# Patient Record
Sex: Female | Born: 1980
Health system: Southern US, Community
[De-identification: ages and names within clinical notes are randomized; demographics above are authoritative.]

## PROBLEM LIST (undated history)

## (undated) DIAGNOSIS — O034 Incomplete spontaneous abortion without complication: Secondary | ICD-10-CM

## (undated) DIAGNOSIS — A6000 Herpesviral infection of urogenital system, unspecified: Secondary | ICD-10-CM

---
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
## 2002-09-05 ENCOUNTER — Other Ambulatory Visit: Admission: RE | Admit: 2002-09-05 | Discharge: 2002-09-05 | Payer: Self-pay | Admitting: *Deleted

## 2003-10-08 ENCOUNTER — Other Ambulatory Visit: Admission: RE | Admit: 2003-10-08 | Discharge: 2003-10-08 | Payer: Self-pay | Admitting: *Deleted

## 2004-10-16 ENCOUNTER — Other Ambulatory Visit: Admission: RE | Admit: 2004-10-16 | Discharge: 2004-10-16 | Payer: Self-pay | Admitting: Obstetrics and Gynecology

## 2005-08-17 ENCOUNTER — Other Ambulatory Visit: Admission: RE | Admit: 2005-08-17 | Discharge: 2005-08-17 | Payer: Self-pay | Admitting: Obstetrics and Gynecology

## 2014-10-12 HISTORY — PX: LEEP: SHX91

## 2016-07-20 DIAGNOSIS — N6320 Unspecified lump in the left breast, unspecified quadrant: Secondary | ICD-10-CM | POA: Diagnosis not present

## 2016-08-04 DIAGNOSIS — N6002 Solitary cyst of left breast: Secondary | ICD-10-CM | POA: Diagnosis not present

## 2016-08-04 DIAGNOSIS — N6320 Unspecified lump in the left breast, unspecified quadrant: Secondary | ICD-10-CM | POA: Diagnosis not present

## 2016-09-07 DIAGNOSIS — H43813 Vitreous degeneration, bilateral: Secondary | ICD-10-CM | POA: Diagnosis not present

## 2016-09-15 DIAGNOSIS — Z23 Encounter for immunization: Secondary | ICD-10-CM | POA: Diagnosis not present

## 2016-09-23 DIAGNOSIS — F411 Generalized anxiety disorder: Secondary | ICD-10-CM | POA: Diagnosis not present

## 2016-10-06 DIAGNOSIS — F411 Generalized anxiety disorder: Secondary | ICD-10-CM | POA: Diagnosis not present

## 2016-10-13 DIAGNOSIS — F411 Generalized anxiety disorder: Secondary | ICD-10-CM | POA: Diagnosis not present

## 2016-10-20 DIAGNOSIS — F411 Generalized anxiety disorder: Secondary | ICD-10-CM | POA: Diagnosis not present

## 2016-11-03 DIAGNOSIS — F411 Generalized anxiety disorder: Secondary | ICD-10-CM | POA: Diagnosis not present

## 2016-11-10 DIAGNOSIS — F411 Generalized anxiety disorder: Secondary | ICD-10-CM | POA: Diagnosis not present

## 2016-11-12 DIAGNOSIS — F411 Generalized anxiety disorder: Secondary | ICD-10-CM | POA: Diagnosis not present

## 2016-11-17 DIAGNOSIS — F411 Generalized anxiety disorder: Secondary | ICD-10-CM | POA: Diagnosis not present

## 2016-12-15 DIAGNOSIS — F411 Generalized anxiety disorder: Secondary | ICD-10-CM | POA: Diagnosis not present

## 2016-12-22 DIAGNOSIS — F411 Generalized anxiety disorder: Secondary | ICD-10-CM | POA: Diagnosis not present

## 2017-01-05 DIAGNOSIS — F411 Generalized anxiety disorder: Secondary | ICD-10-CM | POA: Diagnosis not present

## 2017-01-26 DIAGNOSIS — F329 Major depressive disorder, single episode, unspecified: Secondary | ICD-10-CM | POA: Diagnosis not present

## 2017-01-26 DIAGNOSIS — F429 Obsessive-compulsive disorder, unspecified: Secondary | ICD-10-CM | POA: Diagnosis not present

## 2017-01-26 DIAGNOSIS — F410 Panic disorder [episodic paroxysmal anxiety] without agoraphobia: Secondary | ICD-10-CM | POA: Diagnosis not present

## 2017-02-09 DIAGNOSIS — F411 Generalized anxiety disorder: Secondary | ICD-10-CM | POA: Diagnosis not present

## 2017-02-14 DIAGNOSIS — N390 Urinary tract infection, site not specified: Secondary | ICD-10-CM | POA: Diagnosis not present

## 2017-02-14 DIAGNOSIS — N7689 Other specified inflammation of vagina and vulva: Secondary | ICD-10-CM | POA: Diagnosis not present

## 2017-02-23 DIAGNOSIS — F411 Generalized anxiety disorder: Secondary | ICD-10-CM | POA: Diagnosis not present

## 2017-02-23 DIAGNOSIS — F408 Other phobic anxiety disorders: Secondary | ICD-10-CM | POA: Diagnosis not present

## 2017-02-23 DIAGNOSIS — F329 Major depressive disorder, single episode, unspecified: Secondary | ICD-10-CM | POA: Diagnosis not present

## 2017-03-02 DIAGNOSIS — F422 Mixed obsessional thoughts and acts: Secondary | ICD-10-CM | POA: Diagnosis not present

## 2017-03-16 DIAGNOSIS — F422 Mixed obsessional thoughts and acts: Secondary | ICD-10-CM | POA: Diagnosis not present

## 2017-03-31 DIAGNOSIS — Z118 Encounter for screening for other infectious and parasitic diseases: Secondary | ICD-10-CM | POA: Diagnosis not present

## 2017-03-31 DIAGNOSIS — R300 Dysuria: Secondary | ICD-10-CM | POA: Diagnosis not present

## 2017-03-31 DIAGNOSIS — Z114 Encounter for screening for human immunodeficiency virus [HIV]: Secondary | ICD-10-CM | POA: Diagnosis not present

## 2017-03-31 DIAGNOSIS — R309 Painful micturition, unspecified: Secondary | ICD-10-CM | POA: Diagnosis not present

## 2017-03-31 DIAGNOSIS — Z1159 Encounter for screening for other viral diseases: Secondary | ICD-10-CM | POA: Diagnosis not present

## 2017-03-31 DIAGNOSIS — Z113 Encounter for screening for infections with a predominantly sexual mode of transmission: Secondary | ICD-10-CM | POA: Diagnosis not present

## 2017-03-31 DIAGNOSIS — N9089 Other specified noninflammatory disorders of vulva and perineum: Secondary | ICD-10-CM | POA: Diagnosis not present

## 2017-04-06 DIAGNOSIS — N9089 Other specified noninflammatory disorders of vulva and perineum: Secondary | ICD-10-CM | POA: Diagnosis not present

## 2017-04-06 DIAGNOSIS — F422 Mixed obsessional thoughts and acts: Secondary | ICD-10-CM | POA: Diagnosis not present

## 2017-04-20 DIAGNOSIS — F422 Mixed obsessional thoughts and acts: Secondary | ICD-10-CM | POA: Diagnosis not present

## 2017-04-20 DIAGNOSIS — Z1151 Encounter for screening for human papillomavirus (HPV): Secondary | ICD-10-CM | POA: Diagnosis not present

## 2017-04-20 DIAGNOSIS — Z6822 Body mass index (BMI) 22.0-22.9, adult: Secondary | ICD-10-CM | POA: Diagnosis not present

## 2017-04-20 DIAGNOSIS — R39198 Other difficulties with micturition: Secondary | ICD-10-CM | POA: Diagnosis not present

## 2017-04-20 DIAGNOSIS — Z01419 Encounter for gynecological examination (general) (routine) without abnormal findings: Secondary | ICD-10-CM | POA: Diagnosis not present

## 2017-07-31 DIAGNOSIS — Z23 Encounter for immunization: Secondary | ICD-10-CM | POA: Diagnosis not present

## 2018-04-13 DIAGNOSIS — S76311A Strain of muscle, fascia and tendon of the posterior muscle group at thigh level, right thigh, initial encounter: Secondary | ICD-10-CM | POA: Diagnosis not present

## 2018-04-13 DIAGNOSIS — M545 Low back pain: Secondary | ICD-10-CM | POA: Diagnosis not present

## 2018-04-18 DIAGNOSIS — M545 Low back pain: Secondary | ICD-10-CM | POA: Diagnosis not present

## 2018-04-18 DIAGNOSIS — S76311A Strain of muscle, fascia and tendon of the posterior muscle group at thigh level, right thigh, initial encounter: Secondary | ICD-10-CM | POA: Diagnosis not present

## 2018-04-21 DIAGNOSIS — Z1151 Encounter for screening for human papillomavirus (HPV): Secondary | ICD-10-CM | POA: Diagnosis not present

## 2018-04-21 DIAGNOSIS — N760 Acute vaginitis: Secondary | ICD-10-CM | POA: Diagnosis not present

## 2018-04-21 DIAGNOSIS — Z6820 Body mass index (BMI) 20.0-20.9, adult: Secondary | ICD-10-CM | POA: Diagnosis not present

## 2018-04-21 DIAGNOSIS — Z01419 Encounter for gynecological examination (general) (routine) without abnormal findings: Secondary | ICD-10-CM | POA: Diagnosis not present

## 2018-04-25 DIAGNOSIS — S76311A Strain of muscle, fascia and tendon of the posterior muscle group at thigh level, right thigh, initial encounter: Secondary | ICD-10-CM | POA: Diagnosis not present

## 2018-04-25 DIAGNOSIS — M545 Low back pain: Secondary | ICD-10-CM | POA: Diagnosis not present

## 2018-04-26 DIAGNOSIS — S76311A Strain of muscle, fascia and tendon of the posterior muscle group at thigh level, right thigh, initial encounter: Secondary | ICD-10-CM | POA: Diagnosis not present

## 2018-04-26 DIAGNOSIS — M545 Low back pain: Secondary | ICD-10-CM | POA: Diagnosis not present

## 2018-05-02 DIAGNOSIS — S76311A Strain of muscle, fascia and tendon of the posterior muscle group at thigh level, right thigh, initial encounter: Secondary | ICD-10-CM | POA: Diagnosis not present

## 2018-05-02 DIAGNOSIS — M545 Low back pain: Secondary | ICD-10-CM | POA: Diagnosis not present

## 2018-05-06 DIAGNOSIS — Z130 Encounter for screening for diseases of the blood and blood-forming organs and certain disorders involving the immune mechanism: Secondary | ICD-10-CM | POA: Diagnosis not present

## 2018-05-06 DIAGNOSIS — Z1329 Encounter for screening for other suspected endocrine disorder: Secondary | ICD-10-CM | POA: Diagnosis not present

## 2018-05-06 DIAGNOSIS — Z13220 Encounter for screening for lipoid disorders: Secondary | ICD-10-CM | POA: Diagnosis not present

## 2018-05-06 DIAGNOSIS — Z131 Encounter for screening for diabetes mellitus: Secondary | ICD-10-CM | POA: Diagnosis not present

## 2018-05-06 DIAGNOSIS — Z0000 Encounter for general adult medical examination without abnormal findings: Secondary | ICD-10-CM | POA: Diagnosis not present

## 2018-08-10 DIAGNOSIS — F4320 Adjustment disorder, unspecified: Secondary | ICD-10-CM | POA: Diagnosis not present

## 2019-05-05 DIAGNOSIS — M545 Low back pain: Secondary | ICD-10-CM | POA: Diagnosis not present

## 2019-05-05 DIAGNOSIS — S76311A Strain of muscle, fascia and tendon of the posterior muscle group at thigh level, right thigh, initial encounter: Secondary | ICD-10-CM | POA: Diagnosis not present

## 2019-05-19 DIAGNOSIS — Z20828 Contact with and (suspected) exposure to other viral communicable diseases: Secondary | ICD-10-CM | POA: Diagnosis not present

## 2019-05-19 DIAGNOSIS — Z6820 Body mass index (BMI) 20.0-20.9, adult: Secondary | ICD-10-CM | POA: Diagnosis not present

## 2019-06-25 DIAGNOSIS — Z1159 Encounter for screening for other viral diseases: Secondary | ICD-10-CM | POA: Diagnosis not present

## 2019-07-07 DIAGNOSIS — Z6820 Body mass index (BMI) 20.0-20.9, adult: Secondary | ICD-10-CM | POA: Diagnosis not present

## 2019-07-07 DIAGNOSIS — Z01419 Encounter for gynecological examination (general) (routine) without abnormal findings: Secondary | ICD-10-CM | POA: Diagnosis not present

## 2019-08-06 DIAGNOSIS — Z23 Encounter for immunization: Secondary | ICD-10-CM | POA: Diagnosis not present

## 2019-08-17 ENCOUNTER — Other Ambulatory Visit: Payer: Self-pay

## 2019-08-17 DIAGNOSIS — Z20822 Contact with and (suspected) exposure to covid-19: Secondary | ICD-10-CM

## 2019-08-19 LAB — NOVEL CORONAVIRUS, NAA: SARS-CoV-2, NAA: NOT DETECTED

## 2019-10-19 DIAGNOSIS — H6123 Impacted cerumen, bilateral: Secondary | ICD-10-CM | POA: Diagnosis not present

## 2019-10-20 ENCOUNTER — Ambulatory Visit: Payer: Self-pay | Attending: Internal Medicine

## 2019-10-20 DIAGNOSIS — Z20822 Contact with and (suspected) exposure to covid-19: Secondary | ICD-10-CM | POA: Diagnosis not present

## 2019-10-20 DIAGNOSIS — R519 Headache, unspecified: Secondary | ICD-10-CM | POA: Diagnosis not present

## 2019-10-22 LAB — NOVEL CORONAVIRUS, NAA: SARS-CoV-2, NAA: NOT DETECTED

## 2019-12-03 DIAGNOSIS — J069 Acute upper respiratory infection, unspecified: Secondary | ICD-10-CM | POA: Diagnosis not present

## 2019-12-03 DIAGNOSIS — Z20822 Contact with and (suspected) exposure to covid-19: Secondary | ICD-10-CM | POA: Diagnosis not present

## 2020-02-15 DIAGNOSIS — L700 Acne vulgaris: Secondary | ICD-10-CM | POA: Diagnosis not present

## 2020-07-28 DIAGNOSIS — Z23 Encounter for immunization: Secondary | ICD-10-CM | POA: Diagnosis not present

## 2020-08-06 DIAGNOSIS — Z6821 Body mass index (BMI) 21.0-21.9, adult: Secondary | ICD-10-CM | POA: Diagnosis not present

## 2020-08-06 DIAGNOSIS — Z01419 Encounter for gynecological examination (general) (routine) without abnormal findings: Secondary | ICD-10-CM | POA: Diagnosis not present

## 2020-08-06 DIAGNOSIS — Z30432 Encounter for removal of intrauterine contraceptive device: Secondary | ICD-10-CM | POA: Diagnosis not present

## 2020-08-06 DIAGNOSIS — N760 Acute vaginitis: Secondary | ICD-10-CM | POA: Diagnosis not present

## 2020-09-20 DIAGNOSIS — Z3689 Encounter for other specified antenatal screening: Secondary | ICD-10-CM | POA: Diagnosis not present

## 2020-09-20 DIAGNOSIS — Z3200 Encounter for pregnancy test, result unknown: Secondary | ICD-10-CM | POA: Diagnosis not present

## 2020-09-23 DIAGNOSIS — O209 Hemorrhage in early pregnancy, unspecified: Secondary | ICD-10-CM | POA: Diagnosis not present

## 2020-10-12 NOTE — L&D Delivery Note (Addendum)
   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 Hx LEEP, serial CL stable "Vanishing Twin" 1st trim  First Stage:  Induction of labor:membrane sweep x 2 Onset of labor: 0800 07/02/21 Augmentation: AROM ROM: 2237 07/02/21 Active labor onset: 1800 Analgesia /Anesthesia/Pain control intrapartum: hydrotherapy, IV fentanyl, Epidural   Second Stage:  Complete dilation at 07/02/2021  2240 Onset of pushing at 2240, could not tolerate perineal pressure and requested epidural. Rested x 1 hour then resumed pushing at 0040. FHR second stage category 1   Pushing initially in L and R lateral, H/K, then after epidural in semifowlers position with CNM and L&D staff support, Trey Paula and Shanda Bumps present for birth and supportive. Nuchal Cord: No  Delivery of a Live born female in cephalic presentation, position OA to ROT. Prolonged crowning with very firm perineal band, initially hymenal band sharply released, ineffective in allowing head delivery, then small R mediolateral episiotomy was able to effect delivery of head. Easy shoulders thereafter, Trey Paula assisted with body delivery and placing infant on mother's chest. Vigorous infant with strong cry.   Birth Weight:  3600 g Weight:, English: 7 lb 15 oz APGAR: 9, 9  Newborn Delivery   Birth date/time: 07/03/2021 01:13:00 Delivery type: Vaginal, Spontaneous    Cord double clamped after cessation of pulsation, cut by Trey Paula.  Collection of cord blood for typing completed. Cord blood donation-None  Arterial cord blood sample-No    Third Stage:  Placenta delivered-partial separation, sticky membranes necessitating manual removal from lower segment, cord with 3 vessels . Placenta with trailing calcified membranes, irregular contour, complete, eccentric cord insertion. Uterine tone firm, bleeding small Uterotonics: Pitocin bolus IV Placenta to L&D for  disposal.    laceration identified.  Episiotomy:Right Mediolateral  Local analgesia: epidural  Repair:3.0 vicryl in standard fashion Est. Blood Loss (mL):350.00   Complications: None   Mom to postpartum.  Baby Ellie to Couplet care / Skin to Skin.  Delivery Report:  Review the Delivery Report for details.     Signed: Neta Mends, CNM, MSN 07/03/2021, 1:49 AM

## 2020-10-25 DIAGNOSIS — Z8759 Personal history of other complications of pregnancy, childbirth and the puerperium: Secondary | ICD-10-CM | POA: Diagnosis not present

## 2020-10-29 DIAGNOSIS — Z3201 Encounter for pregnancy test, result positive: Secondary | ICD-10-CM | POA: Diagnosis not present

## 2020-11-14 DIAGNOSIS — Z3201 Encounter for pregnancy test, result positive: Secondary | ICD-10-CM | POA: Diagnosis not present

## 2020-11-29 DIAGNOSIS — Z3A09 9 weeks gestation of pregnancy: Secondary | ICD-10-CM | POA: Diagnosis not present

## 2020-11-29 DIAGNOSIS — O3110X2 Continuing pregnancy after spontaneous abortion of one fetus or more, unspecified trimester, fetus 2: Secondary | ICD-10-CM | POA: Diagnosis not present

## 2020-12-06 DIAGNOSIS — Z3A10 10 weeks gestation of pregnancy: Secondary | ICD-10-CM | POA: Diagnosis not present

## 2020-12-06 DIAGNOSIS — O09529 Supervision of elderly multigravida, unspecified trimester: Secondary | ICD-10-CM | POA: Diagnosis not present

## 2020-12-06 DIAGNOSIS — Z3689 Encounter for other specified antenatal screening: Secondary | ICD-10-CM | POA: Diagnosis not present

## 2020-12-26 NOTE — Progress Notes (Signed)
Date:  12/27/2020   ID:  Lynn [name], DOB Dec 01, 1980, MRN [id number]  PCP:  Shon Hale, MD  Cardiologist:  Tessa Lerner, DO, [facility] (established care 12/26/2020)  REASON FOR CONSULT: Palpitations   REQUESTING PHYSICIAN:  Vick Frees, MD [address] [place],  [place] [address]  Chief Complaint  Patient presents with  . Palpitations  . 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  She is referred to the office at the request of [name], [name], MD for evaluation of palpitations.  Patient states that she is currently 14 weeks into her second pregnancy and is progressing well.  Patient stated that at her 6-week mark she started noticing palpitations with her morning sickness.  However, the palpitations would last throughout the day, intermittent, for 5 seconds in duration and self-limited.  Due to her symptoms she is referred to cardiology for further evaluation and management.  Patient states that since her morning sickness has now improved significantly still has her palpitations.  She continues to have palpitations but not as often.  She probably has 1 or 2 episodes at max per day.  No new medications, herbal supplements, energy drinks, stimulants, and no more than 1 cup of coffee per day.  Her first pregnancy was overall unremarkable.  No prior history of gestational hypertension or diabetes.  No family history of premature coronary artery disease or sudden cardiac death.  Mom does have atrial fibrillation diagnosed in her 46s.  ALLERGIES: No Known Allergies  MEDICATION LIST PRIOR TO VISIT: No outpatient medications have been marked as taking for the 12/27/20 encounter (Office Visit) with Odis Hollingshead, Shuaib Corsino, DO.     PAST MEDICAL HISTORY: History reviewed. No pertinent past medical  history.  PAST SURGICAL HISTORY: History reviewed. No pertinent surgical history.  FAMILY HISTORY: The patient family history includes Atrial fibrillation in her mother.  SOCIAL HISTORY:  The patient  reports that she has never smoked. She has never used smokeless tobacco. She reports that she does not drink alcohol and does not use drugs.  REVIEW OF SYSTEMS: Review of Systems  Constitutional: Negative for chills and fever.  HENT: Negative for hoarse voice and nosebleeds.   Eyes: Negative for discharge, double vision and pain.  Cardiovascular: Negative for chest pain, claudication, dyspnea on exertion, leg swelling, near-syncope, orthopnea, palpitations, paroxysmal nocturnal dyspnea and syncope.  Respiratory: Negative for hemoptysis and shortness of breath.   Musculoskeletal: Negative for muscle cramps and myalgias.  Gastrointestinal: Negative for abdominal pain, constipation, diarrhea, hematemesis, hematochezia, melena, nausea and vomiting.  Neurological: Negative for dizziness and light-headedness.    PHYSICAL EXAM: Vitals with BMI 12/27/2020  Height 5\' 5"   Weight 134 lbs 6 oz  BMI 22.37  Systolic 112  Diastolic 82  Pulse 81   CONSTITUTIONAL: Well-developed and well-nourished. No acute distress.  SKIN: Skin is warm and dry. No rash noted. No cyanosis. No pallor. No jaundice HEAD: Normocephalic and atraumatic.  EYES: No scleral icterus MOUTH/THROAT: Moist oral membranes.  NECK: No JVD present. No thyromegaly noted. No carotid bruits  LYMPHATIC: No visible cervical adenopathy.  CHEST Normal respiratory effort. No intercostal retractions  LUNGS: Clear to auscultation bilaterally.  No stridor. No wheezes. No rales.  CARDIOVASCULAR: Regular rate and rhythm, positive S1-S2, no murmurs rubs or gallops appreciated. ABDOMINAL: No apparent ascites.  EXTREMITIES: No peripheral  edema.  HEMATOLOGIC: No significant bruising NEUROLOGIC: Oriented to person, place, and time. Nonfocal.  Normal muscle tone.  PSYCHIATRIC: Normal mood and affect. Normal behavior. Cooperative  CARDIAC DATABASE: EKG: 12/27/2020: Normal sinus rhythm, 78 bpm, normal axis, without underlying ischemia or injury pattern.    Echocardiogram: No results found for this or any previous visit from the past 1095 days.   Stress Testing: No results found for this or any previous visit from the past 1095 days.  Heart Catheterization: None  LABORATORY DATA: No flowsheet data found.  No flowsheet data found.  Lipid Panel  No results found for: CHOL, TRIG, HDL, CHOLHDL, VLDL, LDLCALC, LDLDIRECT, LABVLDL  No components found for: NTPROBNP No results for input(s): PROBNP in the last 8760 hours. No results for input(s): TSH in the last 8760 hours.  BMP No results for input(s): NA, K, CL, CO2, GLUCOSE, BUN, CREATININE, CALCIUM, GFRNONAA, GFRAA in the last 8760 hours.  HEMOGLOBIN A1C No results found for: HGBA1C, MPG  IMPRESSION:    ICD-10-CM   1. Palpitations  R00.2 EKG 12-Lead    TSH    Magnesium    Basic metabolic panel    PCV ECHOCARDIOGRAM COMPLETE    LONG TERM MONITOR (3-14 DAYS)  2. [redacted] weeks gestation of pregnancy  Z3A.14      RECOMMENDATIONS: [name] is a 40 y.o. 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    Currently the symptoms of palpitations have improved as her morning sickness has essentially resolved.  However they do occur intermittently.  EKG shows normal sinus rhythm without underlying ischemia or injury pattern.  No recent labs for me to review at today's encounter.  I will order a BMP, magnesium, and TSH.  Patient is asked to send [place] a copy if she had recent blood work done at a different institution.  Echocardiogram will be ordered to evaluate for structural heart disease and left ventricular systolic function.  Plan for 14-day extended Holter monitor to evaluate for  underlying dysrhythmias if her symptoms were to worsen.  I will see the patient back in 3 months for reevaluation or sooner if needed.  FINAL MEDICATION LIST END OF ENCOUNTER: No orders of the defined types were placed in this encounter.   There are no discontinued medications.  No current outpatient medications on file.  Orders Placed This Encounter  Procedures  . TSH  . Magnesium  . Basic metabolic panel  . LONG TERM MONITOR (3-14 DAYS)  . EKG 12-Lead  . PCV ECHOCARDIOGRAM COMPLETE    There are no Patient Instructions on file for this visit.   --Continue cardiac medications as reconciled in final medication list. --Return in about 3 months (around 03/29/2021) for Follow up, Palpitations, Review test results. Or sooner if needed. --Continue follow-up with your primary care physician regarding the management of your other chronic comorbid conditions.  Patient's questions and concerns were addressed to her satisfaction. She voices understanding of the instructions provided during this encounter.   This note was created using a voice recognition software as a result there may be grammatical errors inadvertently enclosed that do not reflect the nature of this encounter. Every attempt is made to correct such errors.  Tessa Lerner, [place], [facility]  Pager: [phone] Office: [phone]

## 2020-12-27 ENCOUNTER — Ambulatory Visit: Payer: BC Managed Care – PPO | Admitting: Cardiology

## 2020-12-27 ENCOUNTER — Other Ambulatory Visit: Payer: Self-pay

## 2020-12-27 ENCOUNTER — Encounter: Payer: Self-pay | Admitting: Cardiology

## 2020-12-27 VITALS — BP 112/82 | HR 81 | Temp 98.7°F | Resp 16 | Ht 65.0 in | Wt 134.4 lb

## 2020-12-27 DIAGNOSIS — Z3A14 14 weeks gestation of pregnancy: Secondary | ICD-10-CM | POA: Diagnosis not present

## 2020-12-27 DIAGNOSIS — R002 Palpitations: Secondary | ICD-10-CM | POA: Diagnosis not present

## 2021-01-10 DIAGNOSIS — L710 Perioral dermatitis: Secondary | ICD-10-CM | POA: Diagnosis not present

## 2021-01-10 DIAGNOSIS — Z87898 Personal history of other specified conditions: Secondary | ICD-10-CM

## 2021-01-10 HISTORY — DX: Personal history of other specified conditions: Z87.898

## 2021-01-16 DIAGNOSIS — O09522 Supervision of elderly multigravida, second trimester: Secondary | ICD-10-CM | POA: Diagnosis not present

## 2021-01-16 DIAGNOSIS — Z361 Encounter for antenatal screening for raised alphafetoprotein level: Secondary | ICD-10-CM | POA: Diagnosis not present

## 2021-01-16 DIAGNOSIS — Z3A16 16 weeks gestation of pregnancy: Secondary | ICD-10-CM | POA: Diagnosis not present

## 2021-01-17 ENCOUNTER — Other Ambulatory Visit: Payer: Self-pay

## 2021-01-17 ENCOUNTER — Ambulatory Visit: Payer: BC Managed Care – PPO

## 2021-01-17 DIAGNOSIS — Z0189 Encounter for other specified special examinations: Secondary | ICD-10-CM | POA: Diagnosis not present

## 2021-01-17 DIAGNOSIS — R002 Palpitations: Secondary | ICD-10-CM

## 2021-02-13 DIAGNOSIS — O09522 Supervision of elderly multigravida, second trimester: Secondary | ICD-10-CM | POA: Diagnosis not present

## 2021-02-13 DIAGNOSIS — Z3A20 20 weeks gestation of pregnancy: Secondary | ICD-10-CM | POA: Diagnosis not present

## 2021-02-24 DIAGNOSIS — Z362 Encounter for other antenatal screening follow-up: Secondary | ICD-10-CM | POA: Diagnosis not present

## 2021-03-28 ENCOUNTER — Ambulatory Visit: Payer: BC Managed Care – PPO | Admitting: Cardiology

## 2021-04-11 DIAGNOSIS — Z3689 Encounter for other specified antenatal screening: Secondary | ICD-10-CM | POA: Diagnosis not present

## 2021-04-11 DIAGNOSIS — O09522 Supervision of elderly multigravida, second trimester: Secondary | ICD-10-CM | POA: Diagnosis not present

## 2021-04-25 DIAGNOSIS — Z23 Encounter for immunization: Secondary | ICD-10-CM | POA: Diagnosis not present

## 2021-06-04 ENCOUNTER — Encounter (HOSPITAL_COMMUNITY): Payer: Self-pay | Admitting: Obstetrics

## 2021-06-04 ENCOUNTER — Inpatient Hospital Stay (HOSPITAL_BASED_OUTPATIENT_CLINIC_OR_DEPARTMENT_OTHER): Payer: BC Managed Care – PPO

## 2021-06-04 ENCOUNTER — Other Ambulatory Visit: Payer: Self-pay

## 2021-06-04 ENCOUNTER — Inpatient Hospital Stay (HOSPITAL_COMMUNITY)
Admission: AD | Admit: 2021-06-04 | Discharge: 2021-06-04 | Disposition: A | Discharge status: Home / Self Care | Payer: BC Managed Care – PPO | Attending: Obstetrics | Admitting: Obstetrics

## 2021-06-04 DIAGNOSIS — Y93K9 Activity, other involving animal care: Secondary | ICD-10-CM | POA: Diagnosis not present

## 2021-06-04 DIAGNOSIS — Z3A36 36 weeks gestation of pregnancy: Secondary | ICD-10-CM

## 2021-06-04 DIAGNOSIS — O9A213 Injury, poisoning and certain other consequences of external causes complicating pregnancy, third trimester: Secondary | ICD-10-CM

## 2021-06-04 DIAGNOSIS — S3991XA Unspecified injury of abdomen, initial encounter: Secondary | ICD-10-CM | POA: Diagnosis not present

## 2021-06-04 DIAGNOSIS — W541XXA Struck by dog, initial encounter: Secondary | ICD-10-CM

## 2021-06-04 DIAGNOSIS — Z3685 Encounter for antenatal screening for Streptococcus B: Secondary | ICD-10-CM | POA: Diagnosis not present

## 2021-06-04 DIAGNOSIS — Z3689 Encounter for other specified antenatal screening: Secondary | ICD-10-CM

## 2021-06-04 DIAGNOSIS — T1490XA Injury, unspecified, initial encounter: Secondary | ICD-10-CM

## 2021-06-04 NOTE — MAU Note (Signed)
Bailea Beed is a 40 y.o. at [redacted]w[redacted]d here in MAU reporting: today while at work she got a hit by a door and a dog in her abdomen. Was at the office and was having some uterine irritability and told to come here for further evaluation. No pain, bleeding, or LOF. +FM  Onset of complaint: today  Pain score: 0/10  Vitals:   06/04/21 1348  BP: 115/74  Pulse: 70  Resp: 16  Temp: 98.3 F (36.8 C)  SpO2: 98%     FHT:128  Lab orders placed from triage: none

## 2021-06-04 NOTE — MAU Provider Note (Signed)
History     CSN: [id number]  Arrival date and time: 06/04/21 1338   Event Date/Time   First Provider Initiated Contact with Patient 06/04/21 1421      Chief Complaint  Patient presents with   Fetal Monitoring   HPI  Ms.Shawni Hegg is a 40 y.o.female G2P1001 @ [redacted]w[redacted]d here in MAU for fetal monitoring. At 10:00 am this morning a large dog jump onto her chest/belly while she was standing.  She works at a [employer]. She never had pain or bleeding during or following the event.  She was seen today in the office for a regular scheduled OB appointment and was instructed to come here to have her baby monitored. She reports uterine irritability that is not new.   + fetal movement.   OB History     Gravida  2   Para  1   Term  1   Preterm      AB      Living  1      SAB      IAB      Ectopic      Multiple      Live Births  1           History reviewed. No pertinent past medical history.  History reviewed. No pertinent surgical history.  Family History  Problem Relation Age of Onset   Atrial fibrillation Mother     Social History   Tobacco Use   Smoking status: Never   Smokeless tobacco: Never  Vaping Use   Vaping Use: Never used  Substance Use Topics   Alcohol use: Never   Drug use: Never    Allergies: No Known Allergies  Medications Prior to Admission  Medication Sig Dispense Refill Last Dose   valACYclovir (VALTREX) 500 MG tablet Take 500 mg by mouth 2 (two) times daily.   06/04/2021   No results found for this or any previous visit (from the past 48 hour(s)).   Review of Systems  Constitutional:  Negative for fever.  Gastrointestinal:  Negative for abdominal pain.  Genitourinary:  Negative for vaginal bleeding and vaginal discharge.  Neurological:  Negative for headaches.  Physical Exam   Blood pressure 115/74, pulse 70, temperature 98.3 F (36.8 C), temperature source Oral, resp. rate 16, height 5\' 5"  (1.651 m), weight 70.4 kg, SpO2  98 %.  Physical Exam Constitutional:      General: She is not in acute distress.    Appearance: Normal appearance. She is normal weight. She is not ill-appearing, toxic-appearing or diaphoretic.  HENT:     Head: Normocephalic.  Abdominal:     Palpations: Abdomen is soft.     Tenderness: There is no abdominal tenderness.  Musculoskeletal:        General: Normal range of motion.  Skin:    General: Skin is warm.  Neurological:     Mental Status: She is alert and oriented to person, place, and time.  Psychiatric:        Behavior: Behavior normal.   Fetal Tracing: Baseline: 125 bpm Variability: Moderate  Accelerations: 15x15 Decelerations: None Toco:  UI  MAU Course  Procedures None  MDM  Reactive NST, without signs of abruption.  Reviewed patient and NST with Dr. [place], ok for DC home.   Assessment and Plan   A:  1. NST (non-stress test) reactive   2. Trauma   3. [redacted] weeks gestation of pregnancy      P:  Discharge  home in stable condition Return to MAU if symptoms worsen Warning signs Fetal kick counts  Lattie Riege, Harolyn Rutherford, NP 06/04/2021 5:14 PM

## 2021-06-17 DIAGNOSIS — R609 Edema, unspecified: Secondary | ICD-10-CM | POA: Diagnosis not present

## 2021-06-26 DIAGNOSIS — M5489 Other dorsalgia: Secondary | ICD-10-CM | POA: Diagnosis not present

## 2021-06-30 ENCOUNTER — Inpatient Hospital Stay (HOSPITAL_COMMUNITY): Admit: 2021-06-30 | Payer: Self-pay

## 2021-06-30 DIAGNOSIS — Z0374 Encounter for suspected problem with fetal growth ruled out: Secondary | ICD-10-CM | POA: Diagnosis not present

## 2021-07-02 ENCOUNTER — Other Ambulatory Visit: Payer: Self-pay

## 2021-07-02 ENCOUNTER — Inpatient Hospital Stay (HOSPITAL_COMMUNITY): Payer: BC Managed Care – PPO | Admitting: Anesthesiology

## 2021-07-02 ENCOUNTER — Inpatient Hospital Stay (HOSPITAL_COMMUNITY)
Admission: AD | Admit: 2021-07-02 | Discharge: 2021-07-04 | DRG: 807 | Disposition: A | Discharge status: Home / Self Care | Payer: BC Managed Care – PPO | Attending: Obstetrics & Gynecology | Admitting: Obstetrics & Gynecology

## 2021-07-02 ENCOUNTER — Encounter (HOSPITAL_COMMUNITY): Payer: Self-pay | Admitting: Obstetrics & Gynecology

## 2021-07-02 DIAGNOSIS — Z20822 Contact with and (suspected) exposure to covid-19: Secondary | ICD-10-CM | POA: Diagnosis present

## 2021-07-02 DIAGNOSIS — O9832 Other infections with a predominantly sexual mode of transmission complicating childbirth: Secondary | ICD-10-CM | POA: Diagnosis not present

## 2021-07-02 DIAGNOSIS — O43123 Velamentous insertion of umbilical cord, third trimester: Secondary | ICD-10-CM | POA: Diagnosis present

## 2021-07-02 DIAGNOSIS — A6000 Herpesviral infection of urogenital system, unspecified: Secondary | ICD-10-CM | POA: Diagnosis not present

## 2021-07-02 DIAGNOSIS — Z3A40 40 weeks gestation of pregnancy: Secondary | ICD-10-CM | POA: Diagnosis not present

## 2021-07-02 DIAGNOSIS — Z23 Encounter for immunization: Secondary | ICD-10-CM

## 2021-07-02 DIAGNOSIS — O900 Disruption of cesarean delivery wound: Secondary | ICD-10-CM | POA: Diagnosis not present

## 2021-07-02 DIAGNOSIS — O26893 Other specified pregnancy related conditions, third trimester: Secondary | ICD-10-CM | POA: Diagnosis not present

## 2021-07-02 HISTORY — DX: Herpesviral infection of urogenital system, unspecified: A60.00

## 2021-07-02 LAB — CBC
HCT: 40.4 % (ref 36.0–46.0)
Hemoglobin: 14.1 g/dL (ref 12.0–15.0)
MCH: 33.6 pg (ref 26.0–34.0)
MCHC: 34.9 g/dL (ref 30.0–36.0)
MCV: 96.2 fL (ref 80.0–100.0)
Platelets: 159 10*3/uL (ref 150–400)
RBC: 4.2 MIL/uL (ref 3.87–5.11)
RDW: 12.4 % (ref 11.5–15.5)
WBC: 12.1 10*3/uL — ABNORMAL HIGH (ref 4.0–10.5)
nRBC: 0 % (ref 0.0–0.2)

## 2021-07-02 LAB — TYPE AND SCREEN
ABO/RH(D): A POS
Antibody Screen: NEGATIVE

## 2021-07-02 LAB — RESP PANEL BY RT-PCR (FLU A&B, COVID) ARPGX2
Influenza A by PCR: NEGATIVE
Influenza B by PCR: NEGATIVE
SARS Coronavirus 2 by RT PCR: NEGATIVE

## 2021-07-02 MED ORDER — LACTATED RINGERS IV SOLN: 500.0000 mL | INTRAVENOUS | Status: DC | PRN

## 2021-07-02 MED ORDER — LIDOCAINE-EPINEPHRINE (PF) 1.5 %-1:200000 IJ SOLN
INTRAMUSCULAR | Status: DC | PRN
  Administered 2021-07-02: 5 mL via PERINEURAL

## 2021-07-02 MED ORDER — DIPHENHYDRAMINE HCL 50 MG/ML IJ SOLN: 12.5000 mg | INTRAMUSCULAR | Status: DC | PRN

## 2021-07-02 MED ORDER — OXYTOCIN 10 UNIT/ML IJ SOLN: 10.0000 [IU] | Freq: Once | INTRAMUSCULAR | Status: DC

## 2021-07-02 MED ORDER — OXYTOCIN BOLUS FROM INFUSION
333.0000 mL | Freq: Once | INTRAVENOUS | Status: AC
  Administered 2021-07-03: 333 mL via INTRAVENOUS

## 2021-07-02 MED ORDER — FENTANYL-BUPIVACAINE-NACL 0.5-0.125-0.9 MG/250ML-% EP SOLN
12.0000 mL/h | EPIDURAL | Status: DC | PRN
  Administered 2021-07-02: 12 mL/h via EPIDURAL

## 2021-07-02 MED ORDER — EPHEDRINE 5 MG/ML INJ: 10.0000 mg | INTRAVENOUS | Status: DC | PRN

## 2021-07-02 MED ORDER — ACETAMINOPHEN 325 MG PO TABS: 650.0000 mg | ORAL_TABLET | ORAL | Status: DC | PRN

## 2021-07-02 MED ORDER — LIDOCAINE HCL (PF) 1 % IJ SOLN
INTRAMUSCULAR | Status: DC | PRN
  Administered 2021-07-02: 3 mL via EPIDURAL

## 2021-07-02 MED ORDER — PHENYLEPHRINE 40 MCG/ML (10ML) SYRINGE FOR IV PUSH (FOR BLOOD PRESSURE SUPPORT): 80.0000 ug | PREFILLED_SYRINGE | INTRAVENOUS | Status: DC | PRN

## 2021-07-02 MED ORDER — SOD CITRATE-CITRIC ACID 500-334 MG/5ML PO SOLN: 30.0000 mL | ORAL | Status: DC | PRN

## 2021-07-02 MED ORDER — LACTATED RINGERS IV SOLN: INTRAVENOUS | Status: DC

## 2021-07-02 MED ORDER — FENTANYL CITRATE (PF) 100 MCG/2ML IJ SOLN
100.0000 ug | Freq: Once | INTRAMUSCULAR | Status: AC
  Administered 2021-07-02: 100 ug via INTRAVENOUS

## 2021-07-02 MED ORDER — LIDOCAINE HCL (PF) 1 % IJ SOLN: 30.0000 mL | INTRAMUSCULAR | Status: DC | PRN

## 2021-07-02 MED ORDER — FENTANYL-BUPIVACAINE-NACL 0.5-0.125-0.9 MG/250ML-% EP SOLN
EPIDURAL | Status: AC
  Filled 2021-07-02: qty 250

## 2021-07-02 MED ORDER — OXYTOCIN-SODIUM CHLORIDE 30-0.9 UT/500ML-% IV SOLN
2.5000 [IU]/h | INTRAVENOUS | Status: DC
  Filled 2021-07-02: qty 500

## 2021-07-02 MED ORDER — SODIUM CHLORIDE 0.9% FLUSH: 3.0000 mL | INTRAVENOUS | Status: DC | PRN

## 2021-07-02 MED ORDER — SODIUM CHLORIDE 0.9% FLUSH: 3.0000 mL | Freq: Two times a day (BID) | INTRAVENOUS | Status: DC

## 2021-07-02 MED ORDER — FENTANYL CITRATE (PF) 100 MCG/2ML IJ SOLN
INTRAMUSCULAR | Status: AC
  Filled 2021-07-02: qty 2

## 2021-07-02 MED ORDER — SODIUM CHLORIDE 0.9 % IV SOLN: 250.0000 mL | INTRAVENOUS | Status: DC | PRN

## 2021-07-02 MED ORDER — ONDANSETRON HCL 4 MG/2ML IJ SOLN
4.0000 mg | Freq: Four times a day (QID) | INTRAMUSCULAR | Status: DC | PRN
  Administered 2021-07-02: 4 mg via INTRAVENOUS
  Filled 2021-07-02: qty 2

## 2021-07-02 MED ORDER — LACTATED RINGERS IV SOLN: 500.0000 mL | Freq: Once | INTRAVENOUS | Status: DC

## 2021-07-02 NOTE — Anesthesia Procedure Notes (Addendum)
Epidural Patient location during procedure: OB  Staffing Anesthesiologist: Lucretia Kern, MD Performed: anesthesiologist   Preanesthetic Checklist Completed: patient identified, IV checked, risks and benefits discussed, monitors and equipment checked, pre-op evaluation and timeout performed  Epidural Patient position: sitting Prep: DuraPrep Patient monitoring: heart rate, continuous pulse ox and blood pressure Approach: midline Location: L3-L4 Injection technique: LOR air  Needle:  Needle type: Tuohy  Needle gauge: 18 G Needle length: 9 cm Needle insertion depth: 3.5 cm Catheter type: closed end Catheter size: 20 Guage Catheter at skin depth: 9 cm Test dose: negative and 1.5% lidocaine with Epi 1:200 K  Assessment Events: blood not aspirated, injection not painful, no injection resistance, no paresthesia and negative IV test  Additional Notes Reason for block:procedure for pain

## 2021-07-02 NOTE — Anesthesia Preprocedure Evaluation (Signed)

## 2021-07-02 NOTE — Progress Notes (Addendum)
S: Moaning w/ ctx, used tub for 1 hour then out, ctx more intense but no urge to push. Requests exam.  O: Vitals:   07/02/21 1921  BP: 134/81  Pulse: 81  Resp: 18  Temp: 98.2 F (36.8 C)  Weight: 69.9 kg  Height: 5\' 5"  (1.651 m)     FHT:  120's Doppler, no audible decels UC:   regular, every 2-3 minutes SVE:   Dilation: 9 Effacement (%): 100 Station: -1 Exam by:: Aneshia Jacquet CNM BBOW  A / P: Spontaneous labor, progressing normally  Fetal Wellbeing:  Category I Pain Control:   suboptimal coping with hydrotherapy, offered IV Fentanyl and AROM and agrees  Anticipated MOD:  NSVD  [phone], CNM, MSN 07/02/2021, 10:27 PM

## 2021-07-02 NOTE — MAU Note (Signed)
Pt taken to rm by CNM

## 2021-07-02 NOTE — MAU Note (Addendum)
Pt brought back to MAU by Ivonne Andrew CNM. Taken to RM 130 with ctxs all day. Some bloody show. SVE by CNM and pt 7-8cm. Desires water birth. BS called by Ivonne Andrew CNM with report and awaiting call back from [facility] with room assignment.

## 2021-07-02 NOTE — H&P (Signed)
OB ADMISSION/ HISTORY & PHYSICAL:  Admission Date: 07/02/2021  7:12 PM  Admit Diagnosis: Normal labor [O80, Z37.9]    Lynn [name] is a 40 y.o. female presenting for labor check. Contractions started this morning with increasing frequency and strength for past 2 hours, denies LOF or VB. Notes some pelvic pressure, (+)FM.  S/P repeated membrane sweep x 2 this week, was 3/90/-1 yesterday.  Spouse Trey Paula and doula Shanda Bumps for labor support. Expecting a surprise baby.  Desires hydrotherapy/WB, class completed.   Prenatal History: G2P1001   EDC : 06/30/2021, by 7 wks sono Prenatal care at [facility] since 10 wks. 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 Hx LEEP, serial CL stable "Vanishing Twin" 1st trim  Prenatal Labs: ABO, Rh:   A pos Antibody:  neg Rubella:   imm RPR:   NR HBsAg:   neg HIV:   neg GBS:   neg 1 hr Glucola : 95 Genetic Screening: declined, AFP 1 normal Ultrasound: normal anatomy, posterior placenta, AGA  Vaccines: TDaP          UTD         Flu             planned at discharge                    COVID-19 UTD  Maternal Diabetes: No Genetic Screening: Declined Maternal Ultrasounds/Referrals: Normal Fetal Ultrasounds or other Referrals:  None Maternal Substance Abuse:  No Significant Maternal Medications:  Meds include: Other: Valtrex Significant Maternal Lab Results:  Group B Strep negative Other Comments:  None  Medical / Surgical History :  Past medical history:  Past Medical History:  Diagnosis Date   Herpes genitalia      Past surgical history: History reviewed. No pertinent surgical history.   Family History:  Family History  Problem Relation Age of Onset   Atrial fibrillation Mother      Social History:  reports that she has never smoked. She has never used smokeless tobacco. She reports that she does not drink alcohol and does not use  drugs.  Allergies: Patient has no known allergies.   Current Medications at time of admission:  Medications Prior to Admission  Medication Sig Dispense Refill Last Dose   Prenatal Vit-Fe Fumarate-FA (PRENATAL MULTIVITAMIN) TABS tablet Take 1 tablet by mouth daily at 12 noon.   07/02/2021   valACYclovir (VALTREX) 500 MG tablet Take 500 mg by mouth 2 (two) times daily.        Review of Systems: ROS  Physical Exam: Vital signs and nursing notes reviewed.  Patient Vitals for the past 24 hrs:  BP Temp Pulse Resp Height Weight  07/02/21 1921 134/81 98.2 F (36.8 C) 81 18 5\' 5"  (1.651 m) 68.9 kg     General: AAO x 3, NAD, coping very well Heart: RRR Lungs:CTAB Abdomen: Gravid, NT, Leopold's vertex, fetal spine to maternal L Extremities: no edema Genitalia / VE: Dilation: 7 Effacement (%): 100 Station: -1 Presentation: Vertex Exam by:: Zamyia Gowell CNM  BBOW, vertex  FHR: 120 BPM, mod variability, + accels, no decels TOCO: Ctx q 2-4 min, palp moderate  Labs:   Pending T&S, CBC, RPR  No results for input(s): WBC, HGB, HCT, PLT in the last 72 hours.   Assessment/Plan:  40 y.o. G2P1001 at [redacted]w[redacted]d  Fetal wellbeing - FHT category 1 EFW 7 lbs, AGA  Labor: Active stage  Plan expectant management, water imersion for hydrotherapy when C19 test resulted, may use shower in interim  GBS neg Rubella immune Rh positive  Pain control: desires unmedicated/labor support, hydro Analgesia/anesthesia PRN  Anticipated MOD: NSVB  Plans to breastfeed, declines circumcision. POC discussed with patient and support team, all questions answered.  Dr Juliene Pina notified of admission / plan of care   Neta Mends CNM, MSN 07/02/2021, 7:39 PM

## 2021-07-03 ENCOUNTER — Encounter (HOSPITAL_COMMUNITY): Payer: Self-pay | Admitting: Obstetrics & Gynecology

## 2021-07-03 LAB — CBC
HCT: 36.6 % (ref 36.0–46.0)
Hemoglobin: 12.7 g/dL (ref 12.0–15.0)
MCH: 33.2 pg (ref 26.0–34.0)
MCHC: 34.7 g/dL (ref 30.0–36.0)
MCV: 95.8 fL (ref 80.0–100.0)
Platelets: 147 10*3/uL — ABNORMAL LOW (ref 150–400)
RBC: 3.82 MIL/uL — ABNORMAL LOW (ref 3.87–5.11)
RDW: 12.4 % (ref 11.5–15.5)
WBC: 16.9 10*3/uL — ABNORMAL HIGH (ref 4.0–10.5)
nRBC: 0 % (ref 0.0–0.2)

## 2021-07-03 LAB — RPR: RPR Ser Ql: NONREACTIVE

## 2021-07-03 MED ORDER — WITCH HAZEL-GLYCERIN EX PADS: 1.0000 "application " | MEDICATED_PAD | CUTANEOUS | Status: DC | PRN

## 2021-07-03 MED ORDER — SIMETHICONE 80 MG PO CHEW: 80.0000 mg | CHEWABLE_TABLET | ORAL | Status: DC | PRN

## 2021-07-03 MED ORDER — TETANUS-DIPHTH-ACELL PERTUSSIS 5-2.5-18.5 LF-MCG/0.5 IM SUSY: 0.5000 mL | PREFILLED_SYRINGE | Freq: Once | INTRAMUSCULAR | Status: DC

## 2021-07-03 MED ORDER — ONDANSETRON HCL 4 MG/2ML IJ SOLN: 4.0000 mg | INTRAMUSCULAR | Status: DC | PRN

## 2021-07-03 MED ORDER — BENZOCAINE-MENTHOL 20-0.5 % EX AERO
1.0000 "application " | INHALATION_SPRAY | CUTANEOUS | Status: DC | PRN
  Administered 2021-07-04: 1 via TOPICAL
  Filled 2021-07-03: qty 56

## 2021-07-03 MED ORDER — ACETAMINOPHEN 500 MG PO TABS
1000.0000 mg | ORAL_TABLET | Freq: Four times a day (QID) | ORAL | Status: DC
  Administered 2021-07-03 – 2021-07-04 (×4): 1000 mg via ORAL
  Filled 2021-07-03 (×4): qty 2

## 2021-07-03 MED ORDER — ZOLPIDEM TARTRATE 5 MG PO TABS: 5.0000 mg | ORAL_TABLET | Freq: Every evening | ORAL | Status: DC | PRN

## 2021-07-03 MED ORDER — COCONUT OIL OIL
1.0000 "application " | TOPICAL_OIL | Status: DC | PRN
  Administered 2021-07-04: 1 via TOPICAL

## 2021-07-03 MED ORDER — BISACODYL 10 MG RE SUPP: 10.0000 mg | Freq: Every day | RECTAL | Status: DC | PRN

## 2021-07-03 MED ORDER — ONDANSETRON HCL 4 MG PO TABS: 4.0000 mg | ORAL_TABLET | ORAL | Status: DC | PRN

## 2021-07-03 MED ORDER — DIBUCAINE (PERIANAL) 1 % EX OINT: 1.0000 "application " | TOPICAL_OINTMENT | CUTANEOUS | Status: DC | PRN

## 2021-07-03 MED ORDER — FLEET ENEMA 7-19 GM/118ML RE ENEM: 1.0000 | ENEMA | Freq: Every day | RECTAL | Status: DC | PRN

## 2021-07-03 MED ORDER — DIPHENHYDRAMINE HCL 25 MG PO CAPS: 25.0000 mg | ORAL_CAPSULE | Freq: Four times a day (QID) | ORAL | Status: DC | PRN

## 2021-07-03 MED ORDER — PRENATAL MULTIVITAMIN CH
1.0000 | ORAL_TABLET | Freq: Every day | ORAL | Status: DC
  Administered 2021-07-03 – 2021-07-04 (×2): 1 via ORAL
  Filled 2021-07-03 (×2): qty 1

## 2021-07-03 MED ORDER — IBUPROFEN 600 MG PO TABS
600.0000 mg | ORAL_TABLET | Freq: Four times a day (QID) | ORAL | Status: DC
  Administered 2021-07-03 – 2021-07-04 (×4): 600 mg via ORAL
  Filled 2021-07-03 (×4): qty 1

## 2021-07-03 MED ORDER — SENNOSIDES-DOCUSATE SODIUM 8.6-50 MG PO TABS: 2.0000 | ORAL_TABLET | ORAL | Status: DC

## 2021-07-03 MED ORDER — INFLUENZA VAC SPLIT QUAD 0.5 ML IM SUSY
0.5000 mL | PREFILLED_SYRINGE | INTRAMUSCULAR | Status: AC
  Administered 2021-07-04: 0.5 mL via INTRAMUSCULAR
  Filled 2021-07-03: qty 0.5

## 2021-07-03 NOTE — Anesthesia Postprocedure Evaluation (Signed)
Anesthesia Post Note  Patient: Lynn [name]  Procedure(s) Performed: AN AD HOC LABOR EPIDURAL     Patient location during evaluation: Mother Baby Anesthesia Type: Epidural Level of consciousness: awake and alert and oriented Pain management: satisfactory to patient Vital Signs Assessment: post-procedure vital signs reviewed and stable Respiratory status: respiratory function stable Cardiovascular status: stable Postop Assessment: no headache, no backache, epidural receding, patient able to bend at knees, no signs of nausea or vomiting, adequate PO intake and able to ambulate Anesthetic complications: no   No notable events documented.  Last Vitals:  Vitals:   07/03/21 1324 07/03/21 1700  BP: 113/75 112/76  Pulse: (!) 59 (!) 59  Resp: 17 17  Temp: 36.7 C 36.8 C  SpO2:      Last Pain:  Vitals:   07/03/21 1749  TempSrc:   PainSc: 0-No pain   Pain Goal: Patients Stated Pain Goal: 0 (07/02/21 1929)                 Karleen Dolphin

## 2021-07-04 MED ORDER — IBUPROFEN 600 MG PO TABS: 600.0000 mg | ORAL_TABLET | Freq: Four times a day (QID) | ORAL | 0 refills | Status: DC

## 2021-07-04 MED ORDER — BENZOCAINE-MENTHOL 20-0.5 % EX AERO: 1.0000 "application " | INHALATION_SPRAY | CUTANEOUS | Status: DC | PRN

## 2021-07-04 MED ORDER — SENNOSIDES-DOCUSATE SODIUM 8.6-50 MG PO TABS: 2.0000 | ORAL_TABLET | ORAL | Status: DC

## 2021-07-04 MED ORDER — ACETAMINOPHEN 500 MG PO TABS: 1000.0000 mg | ORAL_TABLET | Freq: Four times a day (QID) | ORAL | 0 refills | Status: DC

## 2021-07-04 MED ORDER — COCONUT OIL OIL: 1.0000 "application " | TOPICAL_OIL | 0 refills | Status: DC | PRN

## 2021-07-04 NOTE — Discharge Summary (Signed)
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 yo [id number] 45w3das admitted in Active Labor on 07/02/2021. Patient had an uncomplicated labor course as follows:  Membrane Rupture Time/Date: 10:37 PM ,07/02/2021   Delivery Method:Vaginal, Spontaneous  Episiotomy: Right Mediolateral  Lacerations:    Patient had an uncomplicated postpartum course.  She is ambulating, tolerating a regular diet, passing flatus, and urinating well. Patient is discharged home in stable condition on 07/04/21.  Newborn Data: Birth date:07/03/2021  Birth time:1:13 AM  Gender:Female  Living status:Living  Apgars:9 ,9  Weight:3600 g  "Ellie"   Magnesium Sulfate received: No BMZ received: No Rhophylac:N/A MMR:N/A T-DaP:Given prenatally Flu: Yes Transfusion:No  Physical exam  Vitals:   07/03/21 1324 07/03/21 1700 07/03/21 2011 07/04/21 0608  BP: 113/75 112/76 121/76 109/78  Pulse: (!) 59 (!) 59 66 (!) 56  Resp: 17 17 16 15   Temp: 98.1 F (36.7 C) 98.2 F (36.8 C) 98.3 F (36.8 C) 97.7 F (36.5 C)  TempSrc: Oral Oral Oral   SpO2:   100% 99%  Weight:      Height:       General: alert, cooperative, and no  distress Heart: RRR Lochia: appropriate Uterine Fundus: firm Perineum: well approximated episiotomy, mild edema, no ecchymosis or evidence of hematoma DVT Evaluation: No evidence of DVT seen on physical exam. Negative Homan's sign. No cords or calf tenderness. Labs: Lab Results  Component Value Date   WBC 16.9 (H) 07/03/2021   HGB 12.7 07/03/2021   HCT 36.6 07/03/2021   MCV 95.8 07/03/2021   PLT 147 (L) 07/03/2021   No flowsheet data found. Edinburgh Score: Edinburgh Postnatal Depression Scale Screening Tool 07/03/2021  I have been able to laugh and see the funny side of things. 0  I have looked forward with enjoyment to things. 0  I have blamed myself unnecessarily when things went wrong. 1  I have been anxious or worried for no good reason. 2  I have felt scared or panicky for no good reason. 2  Things have been getting on top of me. 0  I have been so unhappy that I have had difficulty sleeping. 0  I have felt sad or miserable. 0  I have been so unhappy that I have been crying. 1  The thought of harming myself has occurred to me. 0  Edinburgh Postnatal Depression Scale Total 6      After visit meds:  Allergies as of 07/04/2021   No Known Allergies      Medication List  STOP taking these medications    valACYclovir 500 MG tablet Commonly known as: VALTREX       TAKE these medications    acetaminophen 500 MG tablet Commonly known as: TYLENOL Take 2 tablets (1,000 mg total) by mouth every 6 (six) hours.   benzocaine-Menthol 20-0.5 % Aero Commonly known as: DERMOPLAST Apply 1 application topically as needed for irritation (perineal discomfort).   coconut oil Oil Apply 1 application topically as needed.   ibuprofen 600 MG tablet Commonly known as: ADVIL Take 1 tablet (600 mg total) by mouth every 6 (six) hours.   prenatal multivitamin Tabs tablet Take 1 tablet by mouth daily at 12 noon.   senna-docusate 8.6-50 MG tablet Commonly known as:  Senokot-S Take 2 tablets by mouth daily. Start taking on: July 05, 2021               Discharge Care Instructions  (From admission, onward)           Start     Ordered   07/04/21 0000  Discharge wound care:       Comments: Warm water sitz baths as needed   07/04/21 1118             Discharge home in stable condition Infant Feeding: Breast Infant Disposition:home with mother Discharge instruction: per After Visit Summary and Postpartum booklet. Activity: Advance as tolerated. Pelvic rest for 6 weeks.  Diet: low salt diet Anticipated Birth Control: IUD - Paragard  Postpartum Appointment:6 weeks Additional Postpartum F/U: Postpartum Depression checkup Future Appointments:No future appointments. Follow up Visit:  Follow-up Information     Juliene Pina, CNM. Schedule an appointment as soon as possible for a visit in 6 week(s).   Specialty: Obstetrics and Gynecology Why: Postpartum visit Contact information: [place] [address] [phone]                     07/04/2021 Darliss Cheney, CNM

## 2021-07-08 DIAGNOSIS — O901 Disruption of perineal obstetric wound: Secondary | ICD-10-CM | POA: Diagnosis not present

## 2021-07-08 DIAGNOSIS — Z9889 Other specified postprocedural states: Secondary | ICD-10-CM | POA: Diagnosis not present

## 2021-07-10 DIAGNOSIS — N94819 Vulvodynia, unspecified: Secondary | ICD-10-CM | POA: Diagnosis not present

## 2021-07-15 ENCOUNTER — Telehealth (HOSPITAL_COMMUNITY): Payer: Self-pay | Admitting: *Deleted

## 2021-07-15 NOTE — Telephone Encounter (Signed)
Attempted hospital discharge follow-up call. No answer received. Deforest Hoyles, RN, 07/15/21, [phone].

## 2021-08-04 DIAGNOSIS — N819 Female genital prolapse, unspecified: Secondary | ICD-10-CM | POA: Diagnosis not present

## 2021-08-12 DIAGNOSIS — U071 COVID-19: Secondary | ICD-10-CM

## 2021-08-12 HISTORY — DX: COVID-19: U07.1

## 2021-08-24 DIAGNOSIS — J029 Acute pharyngitis, unspecified: Secondary | ICD-10-CM | POA: Diagnosis not present

## 2021-08-24 DIAGNOSIS — H9209 Otalgia, unspecified ear: Secondary | ICD-10-CM | POA: Diagnosis not present

## 2021-08-25 DIAGNOSIS — Z3202 Encounter for pregnancy test, result negative: Secondary | ICD-10-CM | POA: Diagnosis not present

## 2021-08-26 ENCOUNTER — Encounter (HOSPITAL_BASED_OUTPATIENT_CLINIC_OR_DEPARTMENT_OTHER): Payer: Self-pay | Admitting: Obstetrics and Gynecology

## 2021-08-26 ENCOUNTER — Other Ambulatory Visit: Payer: Self-pay

## 2021-08-26 ENCOUNTER — Other Ambulatory Visit (HOSPITAL_COMMUNITY): Payer: Self-pay | Admitting: Obstetrics and Gynecology

## 2021-08-26 ENCOUNTER — Other Ambulatory Visit: Payer: Self-pay | Admitting: Obstetrics and Gynecology

## 2021-08-26 NOTE — Progress Notes (Addendum)
Spoke w/ via phone for pre-op interview---pt Lab needs dos----   none  per anesthesia surgery orders pending        Lab results------echo 01-17-2021 epic COVID test -----patient states asymptomatic no test needed Arrive at -------900 am 08-27-2021 NPO after MN NO Solid Food.  Clear liquids from MN until---745 am Med rec completed Medications to take morning of surgery -----none Diabetic medication -----n/a Patient instructed no nail polish to be worn day of surgery Patient instructed to bring photo id and insurance card day of surgery Patient aware to have Driver (ride ) / caregiver    for 24 hours after surgery  [name] Patient Special Instructions -----none Pre-Op special Istructions -----none Patient verbalized understanding of instructions that were given at this phone interview. Patient denies shortness of breath, chest pain, fever, cough at this phone interview.

## 2021-08-26 NOTE — Anesthesia Preprocedure Evaluation (Addendum)
Anesthesia Evaluation  Patient identified by MRN, date of birth, ID band Patient awake    Reviewed: Allergy & Precautions, NPO status , Patient's Chart, lab work & pertinent test results  Airway Mallampati: I  TM Distance: >3 FB Neck ROM: Full    Dental no notable dental hx. (+) Teeth Intact, Dental Advisory Given   Pulmonary former smoker,    Pulmonary exam normal breath sounds clear to auscultation       Cardiovascular Exercise Tolerance: Good negative cardio ROS Normal cardiovascular exam Rhythm:Regular Rate:Normal     Neuro/Psych negative neurological ROS  negative psych ROS   GI/Hepatic negative GI ROS, Neg liver ROS,   Endo/Other  negative endocrine ROS  Renal/GU negative Renal ROS     Musculoskeletal   Abdominal   Peds  Hematology   Anesthesia Other Findings   Reproductive/Obstetrics                            Anesthesia Physical Anesthesia Plan  ASA: 1  Anesthesia Plan: General   Post-op Pain Management:    Induction: Intravenous  PONV Risk Score and Plan: 4 or greater and Treatment may vary due to age or medical condition, Ondansetron, Dexamethasone and TIVA  Airway Management Planned: LMA  Additional Equipment: None  Intra-op Plan:   Post-operative Plan:   Informed Consent: I have reviewed the patients History and Physical, chart, labs and discussed the procedure including the risks, benefits and alternatives for the proposed anesthesia with the patient or authorized representative who has indicated his/her understanding and acceptance.     Dental advisory given  Plan Discussed with: CRNA and Anesthesiologist  Anesthesia Plan Comments: (TIVA LMA)       Anesthesia Quick Evaluation

## 2021-08-27 ENCOUNTER — Ambulatory Visit (HOSPITAL_COMMUNITY)
Admission: RE | Admit: 2021-08-27 | Discharge: 2021-08-27 | Disposition: A | Discharge status: Home / Self Care | Payer: BC Managed Care – PPO | Source: Ambulatory Visit | Attending: Obstetrics and Gynecology | Admitting: Obstetrics and Gynecology

## 2021-08-27 ENCOUNTER — Ambulatory Visit (HOSPITAL_BASED_OUTPATIENT_CLINIC_OR_DEPARTMENT_OTHER): Payer: BC Managed Care – PPO | Admitting: Certified Registered Nurse Anesthetist

## 2021-08-27 ENCOUNTER — Encounter (HOSPITAL_BASED_OUTPATIENT_CLINIC_OR_DEPARTMENT_OTHER): Payer: Self-pay | Admitting: Obstetrics and Gynecology

## 2021-08-27 ENCOUNTER — Encounter (HOSPITAL_BASED_OUTPATIENT_CLINIC_OR_DEPARTMENT_OTHER)
Admission: RE | Disposition: A | Discharge status: Home / Self Care | Payer: Self-pay | Source: Ambulatory Visit | Attending: Obstetrics and Gynecology

## 2021-08-27 DIAGNOSIS — Z87891 Personal history of nicotine dependence: Secondary | ICD-10-CM | POA: Insufficient documentation

## 2021-08-27 DIAGNOSIS — O021 Missed abortion: Secondary | ICD-10-CM

## 2021-08-27 DIAGNOSIS — O0289 Other abnormal products of conception: Secondary | ICD-10-CM | POA: Diagnosis not present

## 2021-08-27 HISTORY — DX: Incomplete spontaneous abortion without complication: O03.4

## 2021-08-27 HISTORY — PX: OPERATIVE ULTRASOUND: SHX5996

## 2021-08-27 HISTORY — PX: DILATION AND EVACUATION: SHX1459

## 2021-08-27 LAB — CBC
HCT: 42.4 % (ref 36.0–46.0)
Hemoglobin: 14.8 g/dL (ref 12.0–15.0)
MCH: 33.3 pg (ref 26.0–34.0)
MCHC: 34.9 g/dL (ref 30.0–36.0)
MCV: 95.5 fL (ref 80.0–100.0)
Platelets: 181 10*3/uL (ref 150–400)
RBC: 4.44 MIL/uL (ref 3.87–5.11)
RDW: 11.5 % (ref 11.5–15.5)
WBC: 6.1 10*3/uL (ref 4.0–10.5)
nRBC: 0 % (ref 0.0–0.2)

## 2021-08-27 LAB — TYPE AND SCREEN
ABO/RH(D): A POS
Antibody Screen: NEGATIVE

## 2021-08-27 SURGERY — DILATION AND EVACUATION, UTERUS
Anesthesia: General | Site: Vagina

## 2021-08-27 MED ORDER — PROPOFOL 10 MG/ML IV BOLUS
INTRAVENOUS | Status: DC | PRN
  Administered 2021-08-27: 200 mg via INTRAVENOUS

## 2021-08-27 MED ORDER — DEXMEDETOMIDINE (PRECEDEX) IN NS 20 MCG/5ML (4 MCG/ML) IV SYRINGE
PREFILLED_SYRINGE | INTRAVENOUS | Status: DC | PRN
  Administered 2021-08-27: 8 ug via INTRAVENOUS
  Administered 2021-08-27: 4 ug via INTRAVENOUS

## 2021-08-27 MED ORDER — OXYCODONE HCL 5 MG/5ML PO SOLN: 5.0000 mg | Freq: Once | ORAL | Status: DC | PRN

## 2021-08-27 MED ORDER — LIDOCAINE 2% (20 MG/ML) 5 ML SYRINGE
INTRAMUSCULAR | Status: AC
  Filled 2021-08-27: qty 5

## 2021-08-27 MED ORDER — PROPOFOL 10 MG/ML IV BOLUS
INTRAVENOUS | Status: AC
  Filled 2021-08-27: qty 20

## 2021-08-27 MED ORDER — MIDAZOLAM HCL 5 MG/5ML IJ SOLN
INTRAMUSCULAR | Status: DC | PRN
  Administered 2021-08-27: 1 mg via INTRAVENOUS

## 2021-08-27 MED ORDER — FENTANYL CITRATE (PF) 100 MCG/2ML IJ SOLN
INTRAMUSCULAR | Status: AC
  Filled 2021-08-27: qty 2

## 2021-08-27 MED ORDER — AMISULPRIDE (ANTIEMETIC) 5 MG/2ML IV SOLN: 10.0000 mg | Freq: Once | INTRAVENOUS | Status: DC | PRN

## 2021-08-27 MED ORDER — MIDAZOLAM HCL 2 MG/2ML IJ SOLN
INTRAMUSCULAR | Status: AC
  Filled 2021-08-27: qty 2

## 2021-08-27 MED ORDER — POVIDONE-IODINE 10 % EX SWAB: 2.0000 "application " | Freq: Once | CUTANEOUS | Status: DC

## 2021-08-27 MED ORDER — ONDANSETRON HCL 4 MG/2ML IJ SOLN
INTRAMUSCULAR | Status: DC | PRN
  Administered 2021-08-27: 4 mg via INTRAVENOUS

## 2021-08-27 MED ORDER — HYDROMORPHONE HCL 1 MG/ML IJ SOLN
0.2500 mg | INTRAMUSCULAR | Status: DC | PRN
Start: 2021-08-27 — End: 2021-08-27

## 2021-08-27 MED ORDER — ONDANSETRON HCL 4 MG/2ML IJ SOLN
INTRAMUSCULAR | Status: AC
  Filled 2021-08-27: qty 2

## 2021-08-27 MED ORDER — CEFAZOLIN SODIUM-DEXTROSE 2-4 GM/100ML-% IV SOLN
INTRAVENOUS | Status: AC
  Filled 2021-08-27: qty 100

## 2021-08-27 MED ORDER — DEXAMETHASONE SODIUM PHOSPHATE 4 MG/ML IJ SOLN
INTRAMUSCULAR | Status: DC | PRN
  Administered 2021-08-27: 5 mg via INTRAVENOUS

## 2021-08-27 MED ORDER — PROPOFOL 500 MG/50ML IV EMUL
INTRAVENOUS | Status: AC
  Filled 2021-08-27: qty 50

## 2021-08-27 MED ORDER — KETOROLAC TROMETHAMINE 30 MG/ML IJ SOLN
INTRAMUSCULAR | Status: DC | PRN
  Administered 2021-08-27: 30 mg via INTRAVENOUS

## 2021-08-27 MED ORDER — DEXAMETHASONE SODIUM PHOSPHATE 10 MG/ML IJ SOLN
INTRAMUSCULAR | Status: AC
  Filled 2021-08-27: qty 1

## 2021-08-27 MED ORDER — LACTATED RINGERS IV SOLN: INTRAVENOUS | Status: DC

## 2021-08-27 MED ORDER — MIDAZOLAM HCL 5 MG/5ML IJ SOLN: INTRAMUSCULAR | Status: DC | PRN

## 2021-08-27 MED ORDER — OXYCODONE HCL 5 MG PO TABS: 5.0000 mg | ORAL_TABLET | Freq: Once | ORAL | Status: DC | PRN

## 2021-08-27 MED ORDER — PROPOFOL 500 MG/50ML IV EMUL
INTRAVENOUS | Status: DC | PRN
  Administered 2021-08-27: 125 ug/kg/min via INTRAVENOUS

## 2021-08-27 MED ORDER — ONDANSETRON HCL 4 MG/2ML IJ SOLN: 4.0000 mg | Freq: Once | INTRAMUSCULAR | Status: DC | PRN

## 2021-08-27 MED ORDER — CEFAZOLIN SODIUM-DEXTROSE 2-4 GM/100ML-% IV SOLN
2.0000 g | INTRAVENOUS | Status: AC
  Administered 2021-08-27: 2 g via INTRAVENOUS

## 2021-08-27 MED ORDER — FENTANYL CITRATE (PF) 100 MCG/2ML IJ SOLN
INTRAMUSCULAR | Status: DC | PRN
  Administered 2021-08-27: 50 ug via INTRAVENOUS

## 2021-08-27 MED ORDER — LIDOCAINE HCL (CARDIAC) PF 100 MG/5ML IV SOSY
PREFILLED_SYRINGE | INTRAVENOUS | Status: DC | PRN
  Administered 2021-08-27: 100 mg via INTRAVENOUS

## 2021-08-27 MED ORDER — KETOROLAC TROMETHAMINE 30 MG/ML IJ SOLN
INTRAMUSCULAR | Status: AC
  Filled 2021-08-27: qty 1

## 2021-08-27 MED ORDER — ACETAMINOPHEN 10 MG/ML IV SOLN: 1000.0000 mg | Freq: Once | INTRAVENOUS | Status: DC | PRN

## 2021-08-27 MED ORDER — BUPIVACAINE HCL (PF) 0.25 % IJ SOLN
INTRAMUSCULAR | Status: DC | PRN
  Administered 2021-08-27: 20 mL

## 2021-08-27 SURGICAL SUPPLY — 22 items
CATH ROBINSON RED A/P 16FR (CATHETERS) ×4 IMPLANT
DECANTER SPIKE VIAL GLASS SM (MISCELLANEOUS) ×4 IMPLANT
FILTER UTR ASPR ASSEMBLY (MISCELLANEOUS) ×4 IMPLANT
GLOVE SURG LTX SZ6.5 (GLOVE) ×4 IMPLANT
GLOVE SURG NEOPR MICRO LF SZ7 (GLOVE) ×8 IMPLANT
GLOVE SURG UNDER POLY LF SZ6.5 (GLOVE) ×4 IMPLANT
GLOVE SURG UNDER POLY LF SZ7 (GLOVE) ×4 IMPLANT
GOWN STRL REUS W/ TWL LRG LVL3 (GOWN DISPOSABLE) ×4 IMPLANT
GOWN STRL REUS W/TWL LRG LVL3 (GOWN DISPOSABLE) ×8
HOSE CONNECTING 18IN BERKELEY (TUBING) ×4 IMPLANT
KIT BERKELEY 1ST TRI 3/8 NO TR (MISCELLANEOUS) ×4 IMPLANT
KIT BERKELEY 1ST TRIMESTER 3/8 (MISCELLANEOUS) ×4 IMPLANT
NS IRRIG 1000ML POUR BTL (IV SOLUTION) ×4 IMPLANT
PACK VAGINAL MINOR WOMEN LF (CUSTOM PROCEDURE TRAY) ×4 IMPLANT
PAD OB MATERNITY 4.3X12.25 (PERSONAL CARE ITEMS) ×4 IMPLANT
SET BERKELEY SUCTION TUBING (SUCTIONS) ×4 IMPLANT
TOWEL GREEN STERILE FF (TOWEL DISPOSABLE) ×8 IMPLANT
UNDERPAD 30X36 HEAVY ABSORB (UNDERPADS AND DIAPERS) ×4 IMPLANT
VACURETTE 10 RIGID CVD (CANNULA) IMPLANT
VACURETTE 7MM CVD STRL WRAP (CANNULA) IMPLANT
VACURETTE 8 RIGID CVD (CANNULA) ×4 IMPLANT
VACURETTE 9 RIGID CVD (CANNULA) IMPLANT

## 2021-08-27 NOTE — Op Note (Signed)
Lynn [name], Lynn [name] MEDICAL RECORD NO: [id number] ACCOUNT NO: [id number] DATE OF BIRTH: 1981-10-05 FACILITY: [facility] LOCATION: [facility] PHYSICIAN: Lenoard Aden, MD  Operative Report   DATE OF PROCEDURE: 08/27/2021   PREOPERATIVE DIAGNOSIS:  Retained products of conception with abnormal postpartum bleeding 8 weeks postpartum.  POSTOPERATIVE DIAGNOSIS:  Retained products of conception with abnormal postpartum bleeding 8 weeks postpartum.  PROCEDURE:  Ultrasound guided suction D and E.  SURGEON:  Lenoard Aden, MD.  ASSISTANT:  None.  ANESTHESIA:  Local and general.  ESTIMATED BLOOD LOSS:  Less than 50 mL.  COMPLICATIONS:  None.  DRAINS:  None.  COUNTS:  Correct.  The patient to recovery in good condition.  SPECIMEN:  To pathology.  BRIEF DESCRIPTION OF PROCEDURE:  After being apprised of the risks of anesthesia, infection, bleeding, uterine perforation, possible injury to surrounding organs, need for repair.  The patient was taken to the operating room and administered a general  anesthetic without complications.  Prepped and draped in usual sterile fashion and catheterized for 300 mL.  Exam under anesthesia reveals a retroflexed bulky uterus and no adnexal masses.  Ultrasound was performed showing thickened endometrium with  vascular flow.  Paracervical block with 20cc dilute marcaine solution in standard fashion. At this time, under ultrasound guidance, cervix easily dilated up to a #21 Pratt dilator.  An 8 mm curved suction curette placed and aspiration of products of conception was done without difficulty.   Repeat ultrasound done transvaginally after blunt curettage in a 4 quadrant method revealed the cavity to be empty.   Minimal bleeding noted.  The patient tolerated the procedure well, was awakened and transferred to recovery in good condition.     [email or web address] D: 08/27/2021 11:29:54 am T: 08/27/2021 9:45:00 pm  JOB: [id number]/ [id number]

## 2021-08-27 NOTE — Anesthesia Postprocedure Evaluation (Signed)
Anesthesia Post Note  Patient: Lynn [name]  Procedure(s) Performed: DILATATION AND EVACUATION (Vagina ) OPERATIVE ULTRASOUND (Abdomen)     Patient location during evaluation: PACU Anesthesia Type: General Level of consciousness: awake and alert Pain management: pain level controlled Vital Signs Assessment: post-procedure vital signs reviewed and stable Respiratory status: spontaneous breathing, nonlabored ventilation, respiratory function stable and patient connected to nasal cannula oxygen Cardiovascular status: blood pressure returned to baseline and stable Postop Assessment: no apparent nausea or vomiting Anesthetic complications: no   No notable events documented.  Last Vitals:  Vitals:   08/27/21 1200 08/27/21 1255  BP: 100/70 106/80  Pulse: (!) 54 (!) 56  Resp: 12 15  Temp:  36.5 C  SpO2: (!) 89% 100%    Last Pain:  Vitals:   08/27/21 1255  TempSrc:   PainSc: 0-No pain                 Trevor Iha

## 2021-08-27 NOTE — Discharge Instructions (Addendum)
DO NOT TAKE MOTRIN/ADVIL/IBUPROFEN until after 5:30 today.    Post Anesthesia Home Care Instructions  Activity: Get plenty of rest for the remainder of the day. A responsible adult should stay with you for 24 hours following the procedure.  For the next 24 hours, DO NOT: -Drive a car -Advertising copywriter -Drink alcoholic beverages -Take any medication unless instructed by your physician -Make any legal decisions or sign important papers.  Meals: Start with liquid foods such as gelatin or soup. Progress to regular foods as tolerated. Avoid greasy, spicy, heavy foods. If nausea and/or vomiting occur, drink only clear liquids until the nausea and/or vomiting subsides. Call your physician if vomiting continues.  Special Instructions/Symptoms: Your throat may feel dry or sore from the anesthesia or the breathing tube placed in your throat during surgery. If this causes discomfort, gargle with warm salt water. The discomfort should disappear within 24 hours.  If you had a scopolamine patch placed behind your ear for the management of post- operative nausea and/or vomiting:  1. The medication in the patch is effective for 72 hours, after which it should be removed.  Wrap patch in a tissue and discard in the trash. Wash hands thoroughly with soap and water. 2. You may remove the patch earlier than 72 hours if you experience unpleasant side effects which may include dry mouth, dizziness or visual disturbances. 3. Avoid touching the patch. Wash your hands with soap and water after contact with the patch.   DISCHARGE INSTRUCTIONS: D&C / D&E The following instructions have been prepared to help you care for yourself upon your return home.   Personal hygiene:  Use sanitary pads for vaginal drainage, not tampons.  Shower the day after your procedure.  NO tub baths, pools or Jacuzzis for 2-3 weeks.  Wipe front to back after using the bathroom.  Activity and limitations:  Do NOT drive or operate any  equipment for 24 hours. The effects of anesthesia are still present and drowsiness may result.  Do NOT rest in bed all day.  Walking is encouraged.  Walk up and down stairs slowly.  You may resume your normal activity in one to two days or as indicated by your physician.  Sexual activity: NO intercourse for at least 2 weeks after the procedure, or as indicated by your physician.  Diet: Eat a light meal as desired this evening. You may resume your usual diet tomorrow.  Return to work: You may resume your work activities in one to two days or as indicated by your doctor.  What to expect after your surgery: Expect to have vaginal bleeding/discharge for 2-3 days and spotting for up to 10 days. It is not unusual to have soreness for up to 1-2 weeks. You may have a slight burning sensation when you urinate for the first day. Mild cramps may continue for a couple of days. You may have a regular period in 2-6 weeks.  Call your doctor for any of the following:  Excessive vaginal bleeding, saturating and changing one pad every hour.  Inability to urinate 6 hours after discharge from hospital.  Pain not relieved by pain medication.  Fever of 100.4 F or greater.  Unusual vaginal discharge or odor.   Call for an appointment:

## 2021-08-27 NOTE — Anesthesia Procedure Notes (Signed)
Procedure Name: LMA Insertion Date/Time: 08/27/2021 11:05 AM Performed by: Cleda Clarks, CRNA Pre-anesthesia Checklist: Patient identified, Emergency Drugs available, Suction available and Patient being monitored Patient Re-evaluated:Patient Re-evaluated prior to induction Oxygen Delivery Method: Circle system utilized Preoxygenation: Pre-oxygenation with 100% oxygen Induction Type: IV induction Ventilation: Mask ventilation without difficulty LMA: LMA inserted LMA Size: 4.0 Number of attempts: 1 Placement Confirmation: positive ETCO2 Tube secured with: Tape Dental Injury: Teeth and Oropharynx as per pre-operative assessment

## 2021-08-27 NOTE — H&P (Signed)
Lynn [name] is an 40 y.o. female. 8wk pp with AUB and retained tissue by sono. Quant 41, Nl cbc  Pertinent Gynecological History: Menses: flow is moderate Bleeding: dysfunctional uterine bleeding Contraception: none DES exposure: denies Blood transfusions: none Sexually transmitted diseases: no past history Previous GYN Procedures: DNC  Last mammogram:  na  Date: na Last pap: normal Date: 2022 OB History: G4, P2   Menstrual History: Menarche age: 42 Patient's last menstrual period was 10/15/2020 (approximate).    Past Medical History:  Diagnosis Date   COVID 08/12/2021   sore throat x 1 week all symptoms resolved   Herpes genitalia    History of palpitations 01/2021   with pregnancy resolved now per pt   Retained products of conception after miscarriage     Past Surgical History:  Procedure Laterality Date   LEEP  2016    Family History  Problem Relation Age of Onset   Atrial fibrillation Mother     Social History:  reports that she quit smoking about 8 years ago. Her smoking use included cigarettes. She has a 5.00 pack-year smoking history. She has never used smokeless tobacco. She reports that she does not drink alcohol and does not use drugs.  Allergies: No Known Allergies  Medications Prior to Admission  Medication Sig Dispense Refill Last Dose   senna-docusate (SENOKOT-S) 8.6-50 MG tablet Take 2 tablets by mouth daily.   08/26/2021 at 2200   valACYclovir (VALTREX) 500 MG tablet Take 500 mg by mouth at bedtime.   08/26/2021 at 2200   Prenatal Vit-Fe Fumarate-FA (PRENATAL MULTIVITAMIN) TABS tablet Take 1 tablet by mouth daily at 12 noon.   08/20/2021    Review of Systems  Constitutional: Negative.   All other systems reviewed and are negative.  Blood pressure 120/82, pulse 63, temperature 98.1 F (36.7 C), temperature source Oral, resp. rate 14, height 5' (1.524 m), weight 61 kg, last menstrual period 10/15/2020, SpO2 100 %, unknown if currently  breastfeeding. Physical Exam Vitals and nursing note reviewed.  Constitutional:      Appearance: Normal appearance. She is normal weight.  HENT:     Head: Normocephalic and atraumatic.  Cardiovascular:     Rate and Rhythm: Normal rate.  Pulmonary:     Effort: Pulmonary effort is normal.     Breath sounds: Normal breath sounds.  Abdominal:     General: Abdomen is flat.     Palpations: Abdomen is soft.  Genitourinary:    General: Normal vulva.  Musculoskeletal:        General: Normal range of motion.     Cervical back: Normal range of motion and neck supple.  Skin:    General: Skin is warm and dry.  Neurological:     General: No focal deficit present.     Mental Status: She is alert and oriented to person, place, and time.  Psychiatric:        Mood and Affect: Mood normal.        Behavior: Behavior normal.    Results for orders placed or performed during the hospital encounter of 08/27/21 (from the past 24 hour(s))  CBC     Status: None   Collection Time: 08/27/21  9:31 AM  Result Value Ref Range   WBC 6.1 4.0 - 10.5 K/uL   RBC 4.44 3.87 - 5.11 MIL/uL   Hemoglobin 14.8 12.0 - 15.0 g/dL   HCT 38.1 82.9 - 93.7 %   MCV 95.5 80.0 - 100.0 fL   MCH  33.3 26.0 - 34.0 pg   MCHC 34.9 30.0 - 36.0 g/dL   RDW 16.1 09.6 - 04.5 %   Platelets 181 150 - 400 K/uL   nRBC 0.0 0.0 - 0.2 %    No results found.  Assessment/Plan: PP bleeding , likey retained POC Sono guided D&E, consent done.  Agostino Gorin J 08/27/2021, 10:05 AM

## 2021-08-27 NOTE — Op Note (Signed)
08/27/2021  11:26 AM  PATIENT:  Lynn [name]  40 y.o. female  PRE-OPERATIVE DIAGNOSIS:  8 week postpartum retained products of conception  POST-OPERATIVE DIAGNOSIS:  8 week postpartum retained products of conception  PROCEDURE:  Procedure(s): DILATATION AND EVACUATION OPERATIVE ULTRASOUND  SURGEON:  Surgeon(s): Olivia Mackie, MD  ASSISTANTS: none   ANESTHESIA:   local and general  ESTIMATED BLOOD LOSS: 25 mL   DRAINS: none   LOCAL MEDICATIONS USED:  MARCAINE    and Amount: 20 ml  SPECIMEN:  Source of Specimen:  POC  DISPOSITION OF SPECIMEN:  PATHOLOGY  COUNTS:  YES  DICTATION #: [id number]  PLAN OF CARE: dc home  PATIENT DISPOSITION:  PACU - hemodynamically stable.

## 2021-08-27 NOTE — Transfer of Care (Signed)
Immediate Anesthesia Transfer of Care Note  Patient: Lynn [name]  Procedure(s) Performed: DILATATION AND EVACUATION (Vagina ) OPERATIVE ULTRASOUND (Abdomen)  Patient Location: PACU  Anesthesia Type:General  Level of Consciousness: awake, alert  and oriented  Airway & Oxygen Therapy: Patient Spontanous Breathing and Patient connected to nasal cannula oxygen  Post-op Assessment: Report given to RN and Post -op Vital signs reviewed and stable  Post vital signs: Reviewed and stable  Last Vitals:  Vitals Value Taken Time  BP 112/75 08/27/21 1132  Temp    Pulse 65 08/27/21 1137  Resp 14 08/27/21 1137  SpO2 100 % 08/27/21 1137  Vitals shown include unvalidated device data.  Last Pain:  Vitals:   08/27/21 0912  TempSrc: Oral  PainSc: 0-No pain      Patients Stated Pain Goal: 5 (08/27/21 0912)  Complications: No notable events documented.

## 2021-08-28 ENCOUNTER — Encounter (HOSPITAL_BASED_OUTPATIENT_CLINIC_OR_DEPARTMENT_OTHER): Payer: Self-pay | Admitting: Obstetrics and Gynecology

## 2021-08-28 LAB — SURGICAL PATHOLOGY

## 2021-09-23 IMAGING — US US MFM OB LIMITED
1 series · 15 of 21 positions shown · non-contrast
Comparison: none

[Series 1: us mfm ob limited · 15 of 21 slices shown]
[im 1/21]
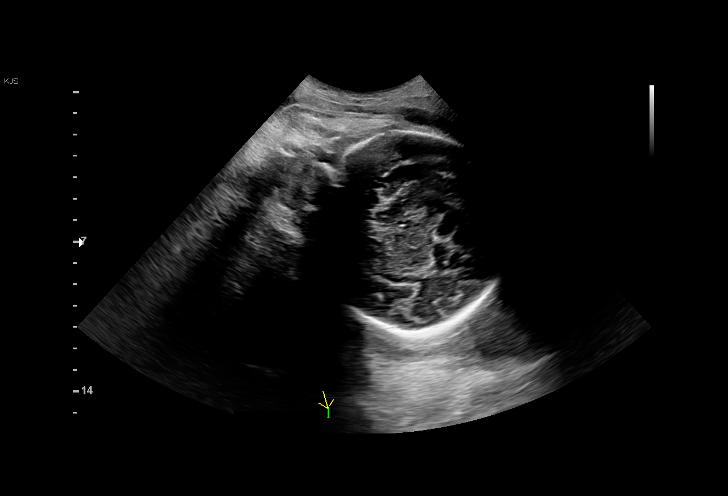
[im 3/21]
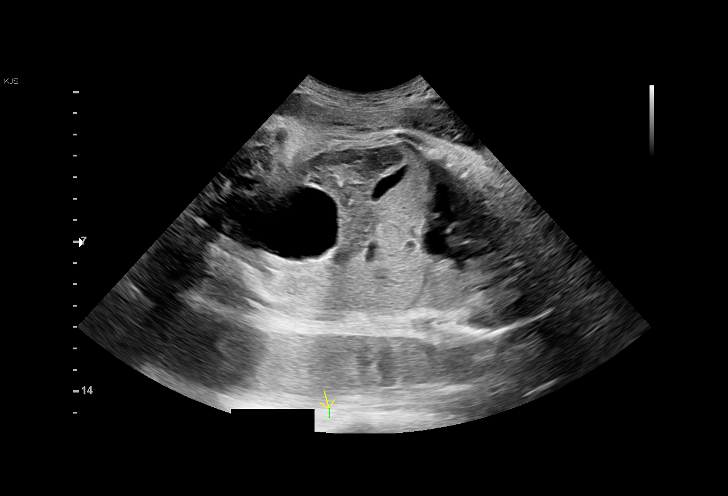
[im 4/21]
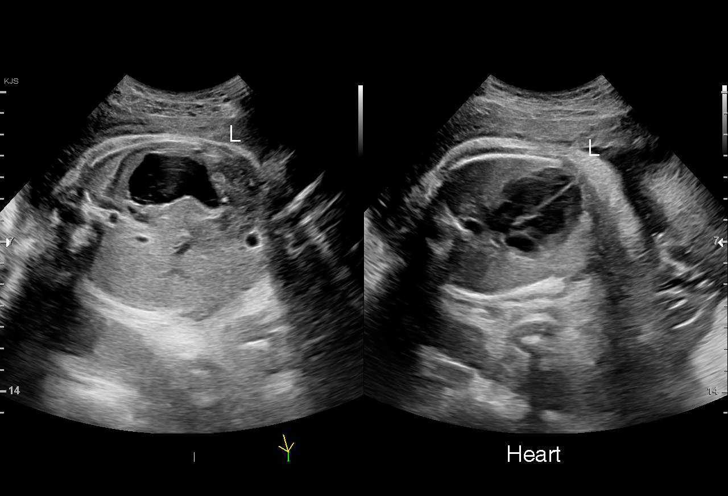
[im 5/21]
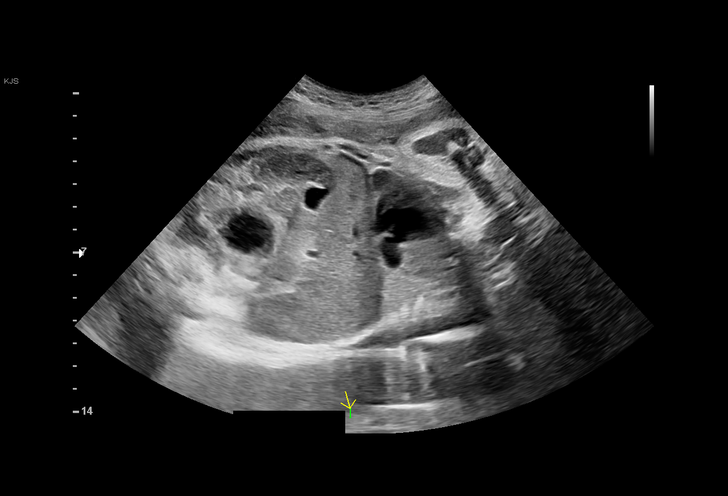
[im 7/21]
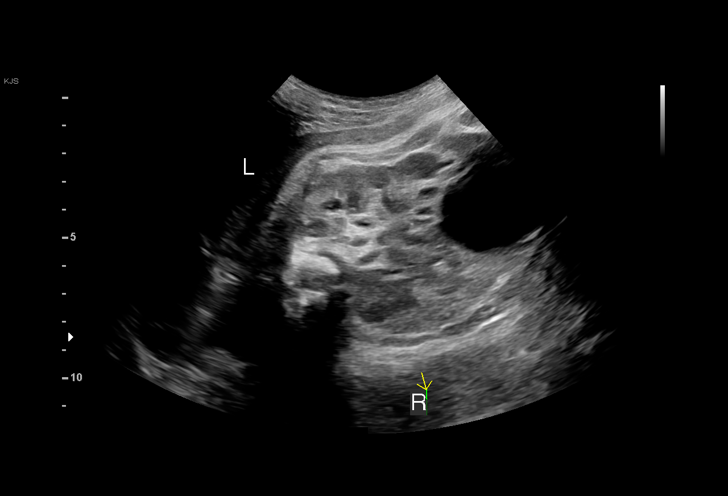
[im 8/21]
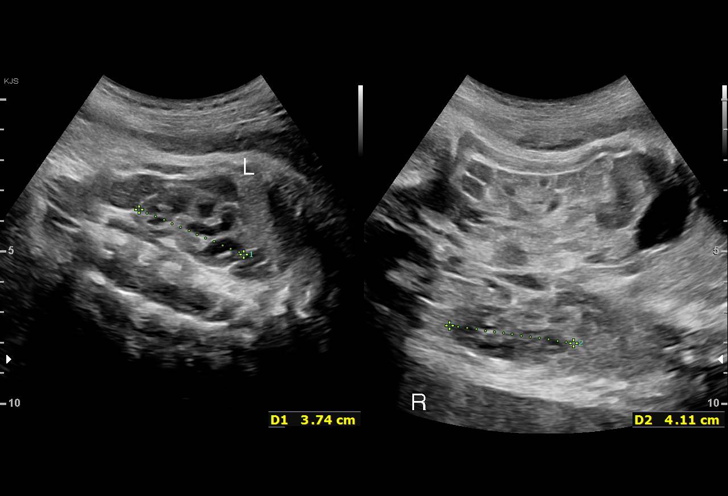
[im 10/21]
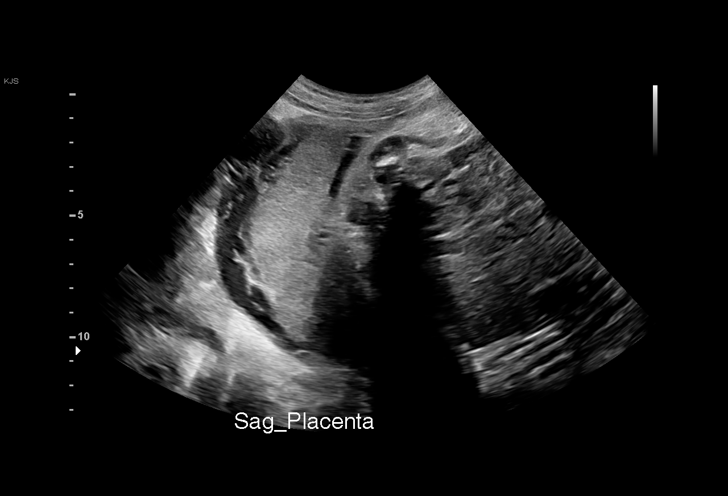
[im 11/21]
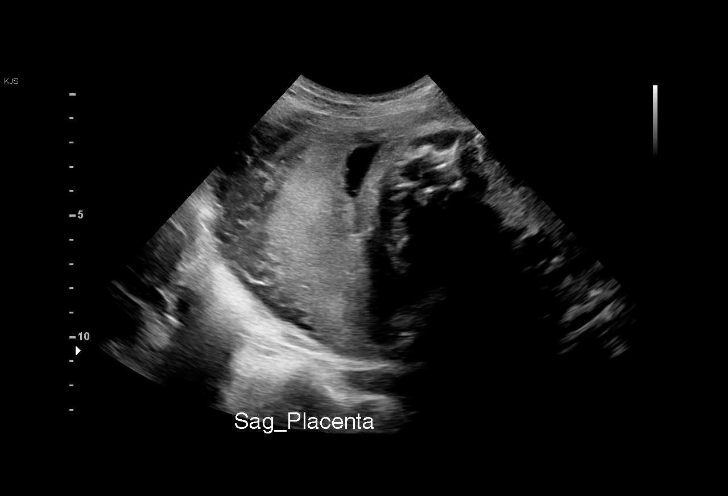
[im 12/21]
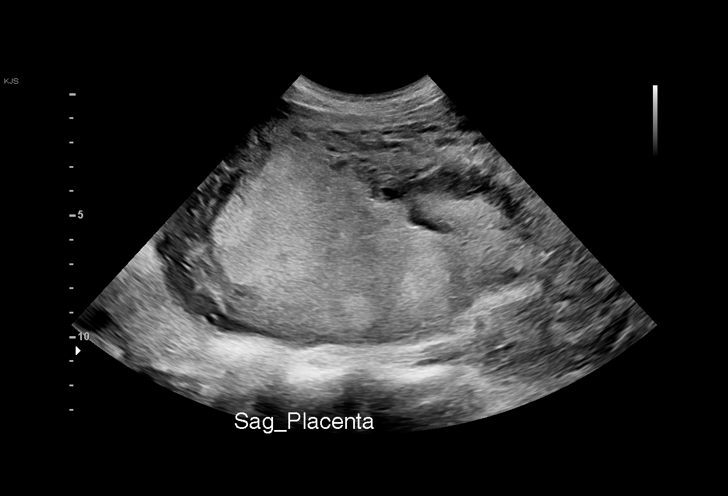
[im 14/21]
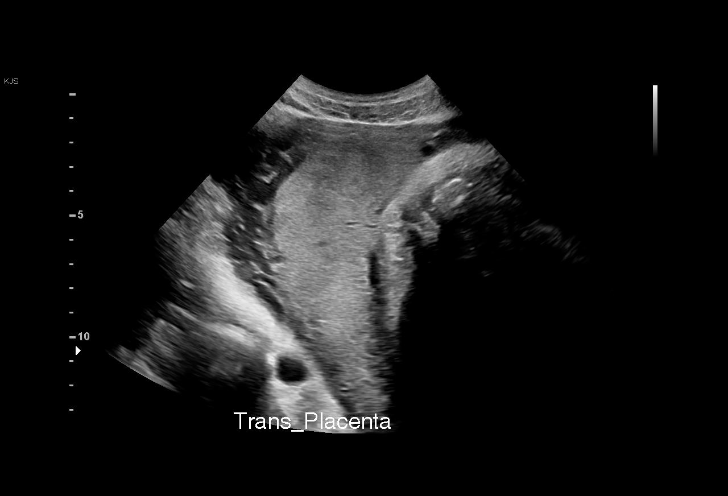
[im 15/21]
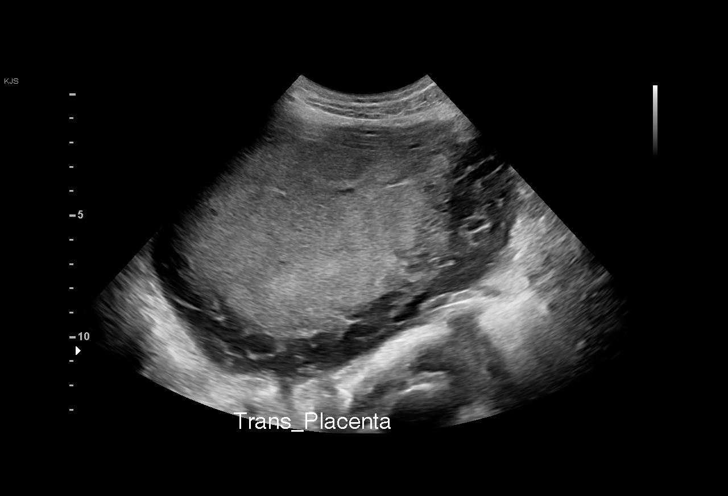
[im 17/21]
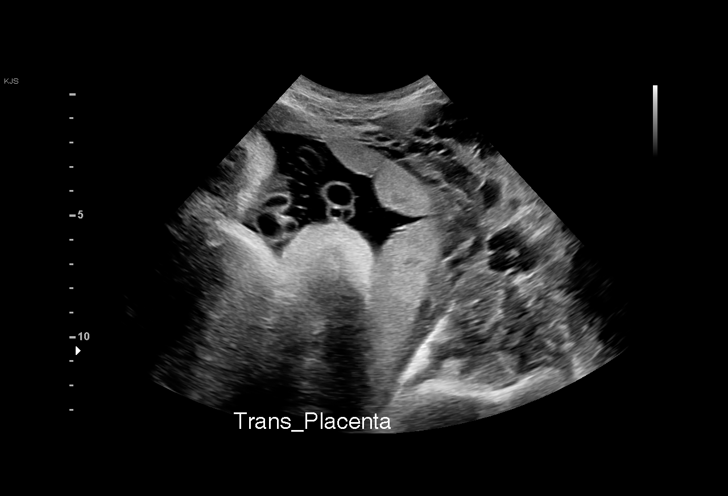
[im 18/21]
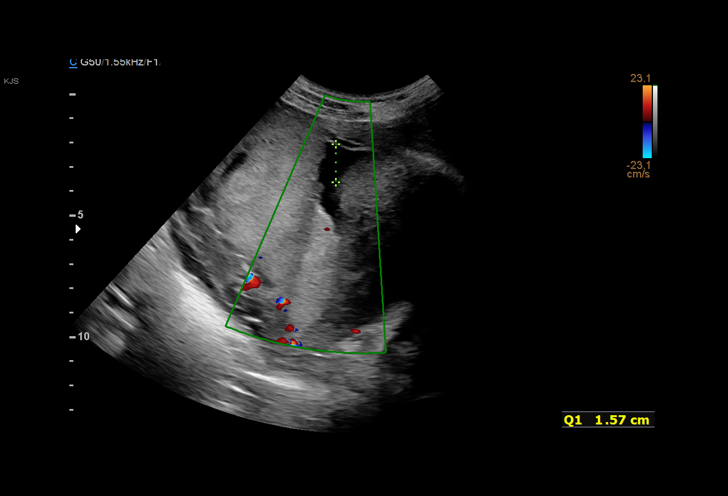
[im 19/21]
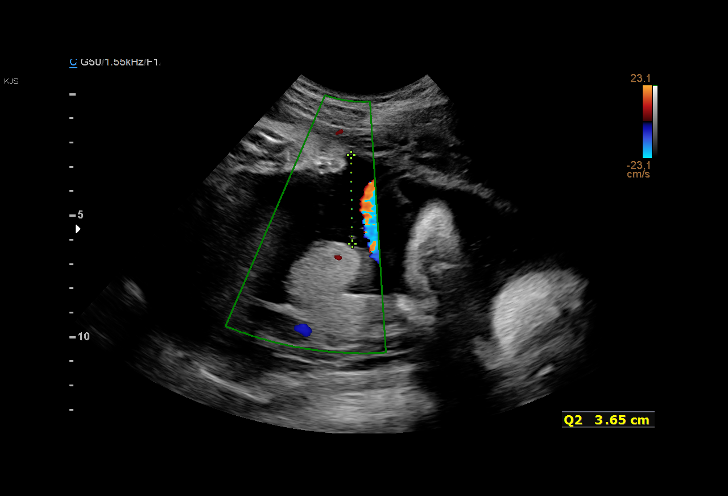
[im 21/21]
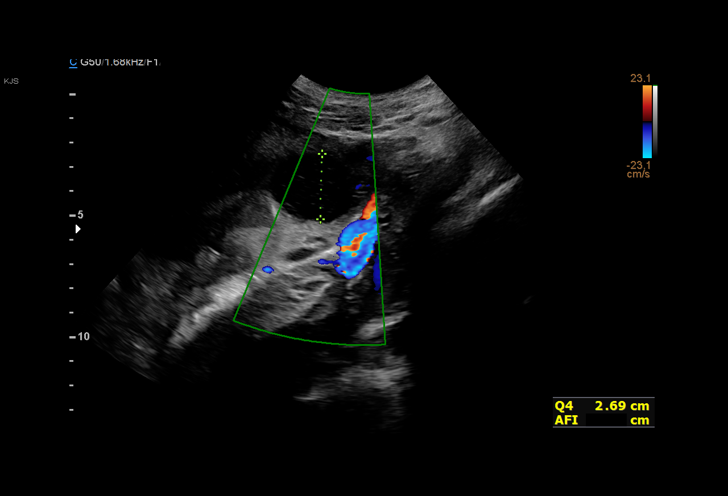

[15 of 21 positions shown; findings below may reference images not displayed]

AWA NP

 1  US MFM OB LIMITED                     76815.01    DAYABHAI RONG

Indications

 Traumatic injury during pregnancy (hit by
 door in abdomen)
 36 weeks gestation of pregnancy
Fetal Evaluation

 Num Of Fetuses:         1
 Fetal Heart Rate(bpm):  133
 Cardiac Activity:       Observed
 Presentation:           Cephalic
 Placenta:               Posterior Fundal
 P. Cord Insertion:      Not well visualized

 Amniotic Fluid
 AFI FV:      Within normal limits

 AFI Sum(cm)     %Tile       Largest Pocket(cm)
 11.51           34

 RUQ(cm)       RLQ(cm)       LUQ(cm)        LLQ(cm)

Gestational Age

 Clinical EDD:  36w 2d                                        EDD:   06/30/21
 Best:          36w 2d     Det. By:  Clinical EDD             EDD:   06/30/21
Anatomy

 Diaphragm:             Appears normal         Kidneys:                Appear normal
 Stomach:               Appears normal, left   Bladder:                Appears normal
                        sided
Cervix Uterus Adnexa

 Cervix
 Not visualized (advanced GA >06wks)
Impression

 Limited exam to due to maternal trauma
 Good fetal movement and amniotic fluid observed.
 Cephalic presentation.
Recommendations

 Follow up growth as clinically indicated.

## 2021-09-24 DIAGNOSIS — Z124 Encounter for screening for malignant neoplasm of cervix: Secondary | ICD-10-CM | POA: Diagnosis not present

## 2021-09-24 DIAGNOSIS — Z30430 Encounter for insertion of intrauterine contraceptive device: Secondary | ICD-10-CM | POA: Diagnosis not present

## 2021-09-24 DIAGNOSIS — Z01419 Encounter for gynecological examination (general) (routine) without abnormal findings: Secondary | ICD-10-CM | POA: Diagnosis not present

## 2021-10-17 DIAGNOSIS — D485 Neoplasm of uncertain behavior of skin: Secondary | ICD-10-CM | POA: Diagnosis not present

## 2021-10-17 DIAGNOSIS — D225 Melanocytic nevi of trunk: Secondary | ICD-10-CM | POA: Diagnosis not present

## 2021-10-17 DIAGNOSIS — D2271 Melanocytic nevi of right lower limb, including hip: Secondary | ICD-10-CM | POA: Diagnosis not present

## 2021-10-17 DIAGNOSIS — L578 Other skin changes due to chronic exposure to nonionizing radiation: Secondary | ICD-10-CM | POA: Diagnosis not present

## 2021-10-17 DIAGNOSIS — L814 Other melanin hyperpigmentation: Secondary | ICD-10-CM | POA: Diagnosis not present

## 2021-11-04 DIAGNOSIS — R197 Diarrhea, unspecified: Secondary | ICD-10-CM | POA: Diagnosis not present

## 2021-11-06 DIAGNOSIS — A0472 Enterocolitis due to Clostridium difficile, not specified as recurrent: Secondary | ICD-10-CM | POA: Diagnosis not present

## 2021-11-20 DIAGNOSIS — Z30431 Encounter for routine checking of intrauterine contraceptive device: Secondary | ICD-10-CM | POA: Diagnosis not present

## 2022-10-08 NOTE — Progress Notes (Unsigned)
[place] Phone: [phone] Subjective:   Lynn [name], am serving as a scribe for Dr. Hulan Saas.  I'm seeing this patient by the request  of:  Glenis Smoker, MD  CC: Knee pain  [id number]  Lynn [name] is a 41 y.o. female coming in with complaint of knee pain for past 2 months. . Patient states that she has pain over patella and to the lateral aspect. Likes to run 10 miles a week and lifts 5x a week. Pain is chronic. Does not brace or use ice.       Past Medical History:  Diagnosis Date   COVID 08/12/2021   sore throat x 1 week all symptoms resolved   Herpes genitalia    History of palpitations 01/2021   with pregnancy resolved now per pt   Retained products of conception after miscarriage    Past Surgical History:  Procedure Laterality Date   DILATION AND EVACUATION N/A 08/27/2021   Procedure: DILATATION AND EVACUATION;  Surgeon: Brien Few, MD;  Location: [facility];  Service: Gynecology;  Laterality: N/A;   LEEP  2016   OPERATIVE ULTRASOUND N/A 08/27/2021   Procedure: OPERATIVE ULTRASOUND;  Surgeon: Brien Few, MD;  Location: [facility];  Service: Gynecology;  Laterality: N/A;   Social History   Socioeconomic History   Marital status: Married    Spouse name: Not on file   Number of children: 1   Years of education: Not on file   Highest education level: Not on file  Occupational History   Not on file  Tobacco Use   Smoking status: Former    Packs/day: 0.50    Years: 10.00    Total pack years: 5.00    Types: Cigarettes    Quit date: 2014    Years since quitting: 10.0   Smokeless tobacco: Never  Vaping Use   Vaping Use: Never used  Substance and Sexual Activity   Alcohol use: Never   Drug use: Never   Sexual activity: Yes  Other Topics Concern   Not on file  Social History Narrative   Not on file   Social Determinants  of Health   Financial Resource Strain: Not on file  Food Insecurity: Not on file  Transportation Needs: Not on file  Physical Activity: Not on file  Stress: Not on file  Social Connections: Not on file   [name] Known Allergies Family History  Problem Relation Age of Onset   Atrial fibrillation Mother      Current Outpatient Medications (Cardiovascular):    nitroGLYCERIN (NITRO-DUR) 0.2 mg/hr patch, Apply 1/4 of a patch to skin once daily.     Current Outpatient Medications (Other):    Prenatal Vit-Fe Fumarate-FA (PRENATAL MULTIVITAMIN) TABS tablet, Take 1 tablet by mouth daily at 12 noon.   senna-docusate (SENOKOT-S) 8.6-50 MG tablet, Take 2 tablets by mouth daily.   valACYclovir (VALTREX) 500 MG tablet, Take 500 mg by mouth at bedtime.   Vitamin D, Ergocalciferol, (DRISDOL) 1.25 MG (50000 UNIT) CAPS capsule, Take 1 capsule (50,000 Units total) by mouth every 7 (seven) days.   Reviewed prior external information including notes and imaging from  primary care provider As well as notes that were available from care everywhere and other healthcare systems.  Past medical history, social, surgical and family history all reviewed in electronic medical record.  [name] pertanent information unless stated regarding to the chief complaint.  Review of Systems:  [name] headache, visual changes, nausea, vomiting, diarrhea, constipation, dizziness, abdominal pain, skin rash, fevers, chills, night sweats, weight loss, swollen lymph nodes, body aches,  chest pain, shortness of breath, mood changes. POSITIVE muscle aches, joint swelling   Objective  Blood pressure 100/78, pulse 95, height 5' (1.524 m), weight 129 lb (58.5 kg), SpO2 99 %, unknown if currently breastfeeding.   General: [name] apparent distress alert and oriented x3 mood and affect normal, dressed appropriately.  HEENT: Pupils equal, extraocular movements intact  Respiratory: Patient's speak in full sentences and does not appear short of  breath  Cardiovascular: [name] lower extremity edema, non tender, [name] erythema  Knee exam shows left knee does have some tenderness to palpation on the anterior lateral aspect of the knee.  [name] significant instability.  Range of motion.  Very mild grinding noted of the patellofemoral joint  Limited muscular skeletal ultrasound was performed and interpreted by Antoine Primas, M  Limited ultrasound has a trace effusion noted of the hypoechoic changes within the irregularity noted of the tibial area.  On the anterior lateral aspect.  Increasing neovascularization that is consistent with potential stress reaction.  Degenerative meniscal tear noted on the medial aspect but [name] significant displacement. Impression: Likely stress reaction or stress fracture.    Impression and Recommendations:    The above documentation has been reviewed and is accurate and complete Judi Saa, DO

## 2022-10-14 ENCOUNTER — Ambulatory Visit: Payer: BC Managed Care – PPO | Admitting: Family Medicine

## 2022-10-14 ENCOUNTER — Ambulatory Visit (INDEPENDENT_AMBULATORY_CARE_PROVIDER_SITE_OTHER): Payer: BC Managed Care – PPO

## 2022-10-14 ENCOUNTER — Encounter: Payer: Self-pay | Admitting: Family Medicine

## 2022-10-14 VITALS — BP 100/78 | HR 95 | Ht 60.0 in | Wt 129.0 lb

## 2022-10-14 DIAGNOSIS — M25562 Pain in left knee: Secondary | ICD-10-CM | POA: Diagnosis not present

## 2022-10-14 DIAGNOSIS — G8929 Other chronic pain: Secondary | ICD-10-CM | POA: Diagnosis not present

## 2022-10-14 DIAGNOSIS — M84369A Stress fracture, unspecified tibia and fibula, initial encounter for fracture: Secondary | ICD-10-CM | POA: Diagnosis not present

## 2022-10-14 MED ORDER — NITROGLYCERIN 0.2 MG/HR TD PT24: MEDICATED_PATCH | TRANSDERMAL | 0 refills | Status: DC

## 2022-10-14 MED ORDER — VITAMIN D (ERGOCALCIFEROL) 1.25 MG (50000 UNIT) PO CAPS: 50000.0000 [IU] | ORAL_CAPSULE | ORAL | 0 refills | Status: AC

## 2022-10-14 NOTE — Patient Instructions (Signed)
Xray L knee Once weekly Vit D K2 252mcg for one month Nitro patches for R knee Nitroglycerin Protocol   Apply 1/4 nitroglycerin patch to affected area daily.  Change position of patch within the affected area every 24 hours.  You may experience a headache during the first 1-2 weeks of using the patch, these should subside.  If you experience headaches after beginning nitroglycerin patch treatment, you may take your preferred over the counter pain reliever.  Another side effect of the nitroglycerin patch is skin irritation or rash related to patch adhesive.  Please notify our office if you develop more severe headaches or rash, and stop the patch.  Tendon healing with nitroglycerin patch may require 12 to 24 weeks depending on the extent of injury.  Men should not use if taking Viagra, Cialis, or Levitra.   Do not use if you have migraines or rosacea. Drop weight 50-60%  Increase 10% each week See me in 4 weeks

## 2022-10-14 NOTE — Assessment & Plan Note (Signed)
Patient has what appears to be a cortical irregularity noted of the tibial plateau on the anterior lateral aspect.  Concerning this is a stress fracture.  Discussed with patient about icing regimen and home exercises, discussed which activities to do and which ones to avoid.  Disc with patient about vitamin D supplementation and given a prescription.  Patient will avoid high impact.  Follow-up with me again in 4 weeks and hopefully we can start to allow patient to progress.

## 2022-10-15 ENCOUNTER — Ambulatory Visit: Payer: BC Managed Care – PPO | Admitting: Sports Medicine

## 2022-11-10 NOTE — Progress Notes (Unsigned)
[place] Phone: [phone] Subjective:   Lynn Lynn [name], am serving as a scribe for Dr. Hulan [name].  I'm seeing this patient by the request  of:  Lynn Smoker, MD  CC: left knee pain   [id number]  10/14/2022 Patient has what appears to be a cortical irregularity noted of the tibial plateau on the anterior lateral aspect.  Concerning this is a stress fracture.  Discussed with patient about icing regimen and home exercises, discussed which activities to do and which ones to avoid.  Disc with patient about vitamin D supplementation and given a prescription.  Patient will avoid high impact.  Follow-up with me again in 4 weeks and hopefully we can start to allow patient to progress.      Update 11/12/2022 Lynn Lynn [name] is a 42 y.o. female coming in with complaint of L knee pain. Patient states that she is 50% better. Squatting elicits the most pain.  Only when she goes past 90 degrees.  Patient describes it as a dull, throbbing aching discomfort when it happens but it is only for seconds and then goes away.      Past Medical History:  Diagnosis Date   COVID 08/12/2021   sore throat x 1 week all symptoms resolved   Herpes genitalia    History of palpitations 01/2021   with pregnancy resolved now per pt   Retained products of conception after miscarriage    Past Surgical History:  Procedure Laterality Date   DILATION AND EVACUATION N/A 08/27/2021   Procedure: DILATATION AND EVACUATION;  Surgeon: Lynn Few, MD;  Location: [facility];  Service: Gynecology;  Laterality: N/A;   LEEP  2016   OPERATIVE ULTRASOUND N/A 08/27/2021   Procedure: OPERATIVE ULTRASOUND;  Surgeon: Lynn Few, MD;  Location: [facility];  Service: Gynecology;  Laterality: N/A;   Social History   Socioeconomic History   Marital status: Married    Spouse name: Not on file   Number of  children: 1   Years of education: Not on file   Highest education level: Not on file  Occupational History   Not on file  Tobacco Use   Smoking status: Former    Packs/day: 0.50    Years: 10.00    Total pack years: 5.00    Types: Cigarettes    Quit date: 2014    Years since quitting: 10.0   Smokeless tobacco: Never  Vaping Use   Vaping Use: Never used  Substance and Sexual Activity   Alcohol use: Never   Drug use: Never   Sexual activity: Yes  Other Topics Concern   Not on file  Social History Narrative   Not on file   Social Determinants of Health   Financial Resource Strain: Not on file  Food Insecurity: Not on file  Transportation Needs: Not on file  Physical Activity: Not on file  Stress: Not on file  Social Connections: Not on file   Lynn [name] Known Allergies Family History  Problem Relation Age of Onset   Atrial fibrillation Mother      Current Outpatient Medications (Cardiovascular):    nitroGLYCERIN (NITRO-DUR) 0.2 mg/hr patch, Apply 1/4 of a patch to skin once daily.     Current Outpatient Medications (Other):    Prenatal Vit-Fe Fumarate-FA (PRENATAL MULTIVITAMIN) TABS tablet, Take 1 tablet by mouth daily at 12 noon.   senna-docusate (SENOKOT-S) 8.6-50 MG tablet, Take 2 tablets by mouth  daily.   valACYclovir (VALTREX) 500 MG tablet, Take 500 mg by mouth at bedtime.   Vitamin D, Ergocalciferol, (DRISDOL) 1.25 MG (50000 UNIT) CAPS capsule, Take 1 capsule (50,000 Units total) by mouth every 7 (seven) days.   Reviewed prior external information including notes and imaging from  primary care provider As well as notes that were available from care everywhere and other healthcare systems.  Past medical history, social, surgical and family history all reviewed in electronic medical record.  Lynn [name] pertanent information unless stated regarding to the chief complaint.   Review of Systems:  Lynn [name] headache, visual changes, nausea, vomiting, diarrhea, constipation,  dizziness, abdominal pain, skin rash, fevers, chills, night sweats, weight loss, swollen lymph nodes, body aches, joint swelling, chest pain, shortness of breath, mood changes. POSITIVE muscle aches  Objective  Blood pressure 102/64, pulse 80, height 5' (1.524 m), weight 138 lb (62.6 kg), last menstrual period 10/04/2022, SpO2 99 %, unknown if currently breastfeeding.   General: Lynn [name] apparent distress alert and oriented x3 mood and affect normal, dressed appropriately.  HEENT: Pupils equal, extraocular movements intact  Respiratory: Patient's speak in full sentences and does not appear short of breath  Cardiovascular: Lynn [name] lower extremity edema, non tender, Lynn [name] erythema  Patient's left knee does have relatively good range of motion.  Very minimally tender over the anterior lateral aspect of the knee.  Lynn [name] significant instability noted of the knee.   Limited muscular skeletal ultrasound was performed and interpreted by Lynn Lynn [name], M   Limited ultrasound still has a cortical irregularity noted of the anterior lateral of the tibia.  Does have decrease in the hypoechoic changes in the area.  Mild increase in neovascularization noted. Impression: Very mild change   Impression and Recommendations:    The above documentation has been reviewed and is accurate and complete Lynn Pulley, DO

## 2022-11-12 ENCOUNTER — Ambulatory Visit (INDEPENDENT_AMBULATORY_CARE_PROVIDER_SITE_OTHER): Payer: BC Managed Care – PPO | Admitting: Family Medicine

## 2022-11-12 ENCOUNTER — Ambulatory Visit: Payer: Self-pay

## 2022-11-12 VITALS — BP 102/64 | HR 80 | Ht 60.0 in | Wt 138.0 lb

## 2022-11-12 DIAGNOSIS — M25562 Pain in left knee: Secondary | ICD-10-CM

## 2022-11-12 NOTE — Patient Instructions (Signed)
Do not go past 85 degrees on squats Keep doing Vit D and K2 See you again in 4-5 weeks

## 2022-11-12 NOTE — Assessment & Plan Note (Signed)
Patient shows some improvement noted.  Still some cortical irregularity noted.  Patient x-rays do show some mild arthritic changes of the lateral compartment but I do not think that this is hopefully contributing to some of the discomfort and pain.  We discussed with patient to continue the vitamin D and the K2 supplementation at the moment.  Continue to avoid high impact exercises.  Follow-up with me again in 6 to 8 weeks and make sure patient is doing 100% better and can then advance as tolerated.

## 2022-12-08 DIAGNOSIS — Z8742 Personal history of other diseases of the female genital tract: Secondary | ICD-10-CM | POA: Diagnosis not present

## 2022-12-08 DIAGNOSIS — Z01419 Encounter for gynecological examination (general) (routine) without abnormal findings: Secondary | ICD-10-CM | POA: Diagnosis not present

## 2022-12-08 DIAGNOSIS — Z09 Encounter for follow-up examination after completed treatment for conditions other than malignant neoplasm: Secondary | ICD-10-CM | POA: Diagnosis not present

## 2022-12-08 NOTE — Progress Notes (Unsigned)
Lynn [name] [address] [place] Phone: [phone] Subjective:   Lynn [name], am serving as a scribe for Dr. Hulan Saas.  I'm seeing this patient by the request  of:  Lynn Smoker, MD  CC: Knee pain and back pain  [id number]  11/12/2022 Patient shows some improvement noted.  Still some cortical irregularity noted.  Patient x-rays do show some mild arthritic changes of the lateral compartment but I do not think that this is hopefully contributing to some of the discomfort and pain.  We discussed with patient to continue the vitamin D and the K2 supplementation at the moment.  Continue to avoid high impact exercises.  Follow-up with me again in 6 to 8 weeks and make sure patient is doing 100% better and can then advance as tolerated.     Update 12/10/2022 Lynn [name] is a 42 y.o. female coming in with complaint of L knee pain. Patient states L knee is doing well. Decrease in frequency and intensity of pain. Better ROM. R hamstring has been a chronic issue.  States that it has been going on for years, now having some discomfort even with her not being quite as active.      Past Medical History:  Diagnosis Date   COVID 08/12/2021   sore throat x 1 week all symptoms resolved   Herpes genitalia    History of palpitations 01/2021   with pregnancy resolved now per pt   Retained products of conception after miscarriage    Past Surgical History:  Procedure Laterality Date   DILATION AND EVACUATION N/A 08/27/2021   Procedure: DILATATION AND EVACUATION;  Surgeon: Brien Few, MD;  Location: [facility];  Service: Gynecology;  Laterality: N/A;   LEEP  2016   OPERATIVE ULTRASOUND N/A 08/27/2021   Procedure: OPERATIVE ULTRASOUND;  Surgeon: Brien Few, MD;  Location: [facility];  Service: Gynecology;  Laterality: N/A;   Social History   Socioeconomic History   Marital status: Married     Spouse name: Not on file   Number of children: 1   Years of education: Not on file   Highest education level: Not on file  Occupational History   Not on file  Tobacco Use   Smoking status: Former    Packs/day: 0.50    Years: 10.00    Total pack years: 5.00    Types: Cigarettes    Quit date: 2014    Years since quitting: 10.1   Smokeless tobacco: Never  Vaping Use   Vaping Use: Never used  Substance and Sexual Activity   Alcohol use: Never   Drug use: Never   Sexual activity: Yes  Other Topics Concern   Not on file  Social History Narrative   Not on file   Social Determinants of Health   Financial Resource Strain: Not on file  Food Insecurity: Not on file  Transportation Needs: Not on file  Physical Activity: Not on file  Stress: Not on file  Social Connections: Not on file   No Known Allergies Family History  Problem Relation Age of Onset   Atrial fibrillation Mother      Current Outpatient Medications (Cardiovascular):    nitroGLYCERIN (NITRO-DUR) 0.2 mg/hr patch, Apply 1/4 of a patch to skin once daily.     Current Outpatient Medications (Other):    Prenatal Vit-Fe Fumarate-FA (PRENATAL MULTIVITAMIN) TABS tablet, Take 1 tablet by mouth daily at 12 noon.   senna-docusate (  SENOKOT-S) 8.6-50 MG tablet, Take 2 tablets by mouth daily.   valACYclovir (VALTREX) 500 MG tablet, Take 500 mg by mouth at bedtime.   Vitamin D, Ergocalciferol, (DRISDOL) 1.25 MG (50000 UNIT) CAPS capsule, Take 1 capsule (50,000 Units total) by mouth every 7 (seven) days.   Reviewed prior external information including notes and imaging from  primary care provider As well as notes that were available from care everywhere and other healthcare systems.  Past medical history, social, surgical and family history all reviewed in electronic medical record.  No pertanent information unless stated regarding to the chief complaint.   Review of Systems:  No headache, visual changes, nausea,  vomiting, diarrhea, constipation, dizziness, abdominal pain, skin rash, fevers, chills, night sweats, weight loss, swollen lymph nodes, body aches, joint swelling, chest pain, shortness of breath, mood changes. POSITIVE muscle aches  Objective  Blood pressure 116/74, pulse 68, height 5' (1.524 m), weight 129 lb (58.5 kg), SpO2 98 %, unknown if currently breastfeeding.   General: No apparent distress alert and oriented x3 mood and affect normal, dressed appropriately.  HEENT: Pupils equal, extraocular movements intact  Respiratory: Patient's speak in full sentences and does not appear short of breath  Cardiovascular: No lower extremity edema, non tender, no erythema  Left knee is nontender on exam today.  Does have some mild tightness noted of the hamstrings bilaterally.  Tender to palpation over the ischial area on the right side. Negative [employer] findings T9 extended rotated and side bent left L1 flexed rotated and side bent right Sacrum right on right Right pelvic shear    Impression and Recommendations:     The above documentation has been reviewed and is accurate and complete Lyndal Pulley, DO

## 2022-12-10 ENCOUNTER — Encounter: Payer: Self-pay | Admitting: Family Medicine

## 2022-12-10 ENCOUNTER — Ambulatory Visit: Payer: Self-pay

## 2022-12-10 ENCOUNTER — Ambulatory Visit: Payer: BC Managed Care – PPO | Admitting: Family Medicine

## 2022-12-10 VITALS — BP 116/74 | HR 68 | Ht 60.0 in | Wt 129.0 lb

## 2022-12-10 DIAGNOSIS — M84369A Stress fracture, unspecified tibia and fibula, initial encounter for fracture: Secondary | ICD-10-CM | POA: Diagnosis not present

## 2022-12-10 DIAGNOSIS — M25562 Pain in left knee: Secondary | ICD-10-CM

## 2022-12-10 DIAGNOSIS — M9902 Segmental and somatic dysfunction of thoracic region: Secondary | ICD-10-CM

## 2022-12-10 DIAGNOSIS — M9904 Segmental and somatic dysfunction of sacral region: Secondary | ICD-10-CM

## 2022-12-10 DIAGNOSIS — M76891 Other specified enthesopathies of right lower limb, excluding foot: Secondary | ICD-10-CM | POA: Diagnosis not present

## 2022-12-10 DIAGNOSIS — M9903 Segmental and somatic dysfunction of lumbar region: Secondary | ICD-10-CM

## 2022-12-10 NOTE — Assessment & Plan Note (Signed)
Seems to be fully healed at this time.  I do not see any other cortical irregularity.  The patient does not have any pain which has been the first time in years.  Is going to start increasing activity again and see how patient responds.  We discussed with patient to continue the vitamin D at the moment.  With patient having hamstring tendinopathy given some exercises as well.  Follow-up again in 6 to 8 weeks.

## 2022-12-10 NOTE — Assessment & Plan Note (Signed)
Proximal aspect, discussed as cleaning, home exercises and icing regimen.  Attempted osteopathic manipulation with some good resolution of some discomfort.  Increase activity slowly.  Follow-up again in 6 to 8 weeks

## 2022-12-10 NOTE — Patient Instructions (Addendum)
Askling exercises Thigh compression with lifting Ok to do higher impact just stretch afterwards See you again in 7-8 weeks

## 2022-12-10 NOTE — Assessment & Plan Note (Signed)

## 2022-12-16 ENCOUNTER — Ambulatory Visit: Payer: Self-pay

## 2023-01-14 ENCOUNTER — Ambulatory Visit: Payer: BC Managed Care – PPO | Admitting: Family Medicine

## 2023-01-14 ENCOUNTER — Ambulatory Visit (INDEPENDENT_AMBULATORY_CARE_PROVIDER_SITE_OTHER): Payer: BC Managed Care – PPO

## 2023-01-14 ENCOUNTER — Encounter: Payer: Self-pay | Admitting: Family Medicine

## 2023-01-14 ENCOUNTER — Other Ambulatory Visit: Payer: Self-pay

## 2023-01-14 VITALS — BP 98/72 | HR 72 | Ht 60.0 in | Wt 130.0 lb

## 2023-01-14 DIAGNOSIS — M9902 Segmental and somatic dysfunction of thoracic region: Secondary | ICD-10-CM

## 2023-01-14 DIAGNOSIS — M5410 Radiculopathy, site unspecified: Secondary | ICD-10-CM | POA: Diagnosis not present

## 2023-01-14 DIAGNOSIS — M25562 Pain in left knee: Secondary | ICD-10-CM | POA: Diagnosis not present

## 2023-01-14 DIAGNOSIS — R102 Pelvic and perineal pain: Secondary | ICD-10-CM | POA: Diagnosis not present

## 2023-01-14 DIAGNOSIS — M545 Low back pain, unspecified: Secondary | ICD-10-CM | POA: Diagnosis not present

## 2023-01-14 DIAGNOSIS — S76311A Strain of muscle, fascia and tendon of the posterior muscle group at thigh level, right thigh, initial encounter: Secondary | ICD-10-CM | POA: Diagnosis not present

## 2023-01-14 DIAGNOSIS — M9904 Segmental and somatic dysfunction of sacral region: Secondary | ICD-10-CM | POA: Diagnosis not present

## 2023-01-14 DIAGNOSIS — M9903 Segmental and somatic dysfunction of lumbar region: Secondary | ICD-10-CM | POA: Diagnosis not present

## 2023-01-14 NOTE — Patient Instructions (Addendum)
Xray today Use nitro Thigh compression  Heel lifts No RDLs, running or explosive movements See me in 6 weeks

## 2023-01-14 NOTE — Assessment & Plan Note (Addendum)
New problem, start nitroglycerin patches which patient has done previously.  Discussed with patient about icing regimen and home exercises.  Discussed which activities to do and which ones to avoid.  X-rays ordered today to further evaluate if any lumbar radiculopathy could also be contributing.  Follow-up again in 6 to 8 weeks

## 2023-01-14 NOTE — Progress Notes (Signed)
[place] Phone: [phone] Subjective:   Lynn Lynn [name], am serving as a scribe for Dr. Hulan Saas.  I'm seeing this patient by the request  of:  Glenis Smoker, MD  CC: Back and neck pain  [id number]  Lynn Lynn [name] is a 42 y.o. female coming in with complaint of back and neck pain. OMT 12/10/2022. Also f/u for L knee pain. Patient states that her knee pain has improved.   R hamstring issue after running 10k. Developed sharp pain in hamstring. Unable to do squats or lunges and is not running. Has hx of hamstring strain 4 years ago. Does have pain that radiates down her leg and her lower back is also bothering her.           Reviewed prior external information including notes and imaging from previsou exam, outside providers and external EMR if available.   As well as notes that were available from care everywhere and other healthcare systems.  Past medical history, social, surgical and family history all reviewed in electronic medical record.  Lynn [name] pertanent information unless stated regarding to the chief complaint.   Past Medical History:  Diagnosis Date   COVID 08/12/2021   sore throat x 1 week all symptoms resolved   Herpes genitalia    History of palpitations 01/2021   with pregnancy resolved now per pt   Retained products of conception after miscarriage     Lynn [name] Known Allergies   Review of Systems:  Lynn [name] headache, visual changes, nausea, vomiting, diarrhea, constipation, dizziness, abdominal pain, skin rash, fevers, chills, night sweats, weight loss, swollen lymph nodes, body aches, joint swelling, chest pain, shortness of breath, mood changes. POSITIVE muscle aches  Objective  Blood pressure 98/72, pulse 72, height 5' (1.524 m), weight 130 lb (59 kg), SpO2 98 %, unknown if currently breastfeeding.   General: Lynn [name] apparent distress alert and oriented x3 mood and affect normal, dressed  appropriately.  HEENT: Pupils equal, extraocular movements intact  Respiratory: Patient's speak in full sentences and does not appear short of breath  Cardiovascular: Lynn [name] lower extremity edema, non tender, Lynn [name] erythema  Low back exam does have some loss of lordosis.  Patient does have tightness with straight leg test but Lynn [name] true radicular symptoms.  Patient has worsening pain now with resisted flexion of the knee at the proximal hamstring area.  With palpation Lynn [name] significant defect noted.  Limited muscular skeletal ultrasound was performed and interpreted by Hulan Saas, M  Limited ultrasound shows that patient does have what appears to be a small avulsion noted of the ischium at the origin of the hamstring tendon.  Small partial tearing noted as well. Impression: Partial tear of the right hamstring origin with questionable avulsion  Osteopathic findings  T8 extended rotated and side bent left L1 flexed rotated and side bent right Sacrum right on right       Assessment and Plan:  Strain of insertion of tendon of right hamstring muscle New problem, start nitroglycerin patches which patient has done previously.  Discussed with patient about icing regimen and home exercises.  Discussed which activities to do and which ones to avoid.  X-rays ordered today to further evaluate if any lumbar radiculopathy could also be contributing.  Follow-up again in 6 to 8 weeks    Nonallopathic problems  Decision today to treat with OMT was based on Physical Exam  After verbal consent patient was treated with HVLA, ME,  FPR techniques in thoracic, lumbar, and sacral  areas  Patient tolerated the procedure well with improvement in symptoms  Patient given exercises, stretches and lifestyle modifications  See medications in patient instructions if given  Patient will follow up in 4-8 weeks    The above documentation has been reviewed and is accurate and complete Lynn Pulley, DO           Note: This dictation was prepared with Dragon dictation along with smaller phrase technology. Any transcriptional errors that result from this process are unintentional.

## 2023-01-27 NOTE — Progress Notes (Unsigned)
[name] [address] [place] [address] Phone: [phone] Subjective:   Lynn [name], am serving as a scribe for Dr. Antoine Primas.  I'm seeing this patient by the request  of:  Shon Hale, MD  CC: Right hip and leg weakness  [id number]  01/14/2023 New problem, start nitroglycerin patches which patient has done previously.  Discussed with patient about icing regimen and home exercises.  Discussed which activities to do and which ones to avoid.  X-rays ordered today to further evaluate if any lumbar radiculopathy could also be contributing.  Follow-up again in 6 to 8 weeks     Update 01/28/2023 Lynn [name] is a 42 y.o. female coming in with complaint of R leg pain. Lumbar and pelvis xray (-) last visit. Patient states that she sits a lot at work and her R leg tingling that radiates to her foot is increasing since her last visit. No new injury.  Affecting daily activities, waking her up at night, difficult to get into a seated position.  Patient is unable to find a comfortable position.  When she starts doing increasing activity she feels better.     Past Medical History:  Diagnosis Date   COVID 08/12/2021   sore throat x 1 week all symptoms resolved   Herpes genitalia    History of palpitations 01/2021   with pregnancy resolved now per pt   Retained products of conception after miscarriage    Past Surgical History:  Procedure Laterality Date   DILATION AND EVACUATION N/A 08/27/2021   Procedure: DILATATION AND EVACUATION;  Surgeon: Olivia Mackie, MD;  Location: [facility];  Service: Gynecology;  Laterality: N/A;   LEEP  2016   OPERATIVE ULTRASOUND N/A 08/27/2021   Procedure: OPERATIVE ULTRASOUND;  Surgeon: Olivia Mackie, MD;  Location: [facility];  Service: Gynecology;  Laterality: N/A;   Social History   Socioeconomic History   Marital status: Married    Spouse name: Not on file    Number of children: 1   Years of education: Not on file   Highest education level: Not on file  Occupational History   Not on file  Tobacco Use   Smoking status: Former    Packs/day: 0.50    Years: 10.00    Additional pack years: 0.00    Total pack years: 5.00    Types: Cigarettes    Quit date: 2014    Years since quitting: 10.3   Smokeless tobacco: Never  Vaping Use   Vaping Use: Never used  Substance and Sexual Activity   Alcohol use: Never   Drug use: Never   Sexual activity: Yes  Other Topics Concern   Not on file  Social History Narrative   Not on file   Social Determinants of Health   Financial Resource Strain: Not on file  Food Insecurity: Not on file  Transportation Needs: Not on file  Physical Activity: Not on file  Stress: Not on file  Social Connections: Not on file   No Known Allergies Family History  Problem Relation Age of Onset   Atrial fibrillation Mother     Current Outpatient Medications (Endocrine & Metabolic):    predniSONE (DELTASONE) 20 MG tablet, Take 2 tablets (40 mg total) by mouth daily with breakfast.  Current Outpatient Medications (Cardiovascular):    nitroGLYCERIN (NITRO-DUR) 0.2 mg/hr patch, Apply 1/4 of a patch to skin once daily.     Current Outpatient Medications (Other):  gabapentin (NEURONTIN) 100 MG capsule, Take 2 capsules (200 mg total) by mouth at bedtime.   Prenatal Vit-Fe Fumarate-FA (PRENATAL MULTIVITAMIN) TABS tablet, Take 1 tablet by mouth daily at 12 noon.   senna-docusate (SENOKOT-S) 8.6-50 MG tablet, Take 2 tablets by mouth daily.   valACYclovir (VALTREX) 500 MG tablet, Take 500 mg by mouth at bedtime.   Vitamin D, Ergocalciferol, (DRISDOL) 1.25 MG (50000 UNIT) CAPS capsule, Take 1 capsule (50,000 Units total) by mouth every 7 (seven) days.   Reviewed prior external information including notes and imaging from  primary care provider As well as notes that were available from care everywhere and other  healthcare systems.  Past medical history, social, surgical and family history all reviewed in electronic medical record.  No pertanent information unless stated regarding to the chief complaint.   Review of Systems:  No headache, visual changes, nausea, vomiting, diarrhea, constipation, dizziness, abdominal pain, skin rash, fevers, chills, night sweats, weight loss, swollen lymph nodes, body aches, joint swelling, chest pain, shortness of breath, mood changes. POSITIVE muscle aches  Objective  Blood pressure 102/70, pulse 78, height 5' (1.524 m), weight 129 lb (58.5 kg), last menstrual period 12/24/2022, SpO2 99 %, unknown if currently breastfeeding.   General: No apparent distress alert and oriented x3 mood and affect normal, dressed appropriately.  HEENT: Pupils equal, extraocular movements intact  Respiratory: Patient's speak in full sentences and does not appear short of breath  Cardiovascular: No lower extremity edema, non tender, no erythema  Right leg does have tightness noted with straight leg test.  Negative though radicular symptoms.  Patient does have what seems to be some mild weakness with hip abductor compared to the contralateral side.  Deep tendon reflexes are intact.   After verbal consent patient was prepped with alcohol swab and with a 21-gauge 2 inch needle injected into the right greater trochanteric area with 2 cc of 0.5% Marcaine and 1 cc of Kenalog 40 mg/mL.  No blood loss.  Band-Aid placed.  Postinjection instructions given    Impression and Recommendations:      The above documentation has been reviewed and is accurate and complete Judi Saa, DO

## 2023-01-28 ENCOUNTER — Encounter: Payer: Self-pay | Admitting: Family Medicine

## 2023-01-28 ENCOUNTER — Ambulatory Visit: Payer: BC Managed Care – PPO | Admitting: Family Medicine

## 2023-01-28 VITALS — BP 102/70 | HR 78 | Ht 60.0 in | Wt 129.0 lb

## 2023-01-28 DIAGNOSIS — M7061 Trochanteric bursitis, right hip: Secondary | ICD-10-CM | POA: Insufficient documentation

## 2023-01-28 MED ORDER — PREDNISONE 20 MG PO TABS: 40.0000 mg | ORAL_TABLET | Freq: Every day | ORAL | 0 refills | Status: DC

## 2023-01-28 MED ORDER — GABAPENTIN 100 MG PO CAPS: 200.0000 mg | ORAL_CAPSULE | Freq: Every day | ORAL | 0 refills | Status: DC

## 2023-01-28 NOTE — Assessment & Plan Note (Signed)
Patient has signs and symptoms consistent with more of a recent injury of may be gluteal tendon and causing more of a bursitis.  We discussed with patient and attempted a injection today.  Patient had some improvement in some of the pain but still had some weakness.  Differential includes more of a lumbar radiculopathy that is concerning as well.  Discussed with patient about icing regimen and home exercises, prednisone and gabapentin given as well in case the lumbar radiculopathy is playing a role.  Patient will be following up with me again in 2 to 3 weeks

## 2023-01-28 NOTE — Patient Instructions (Addendum)
Gabapentin  prescribed Prednisone  for 5 days  Okay to start working out on Monday Write if not better See you again at next appointment

## 2023-02-04 ENCOUNTER — Ambulatory Visit: Payer: BC Managed Care – PPO | Admitting: Family Medicine

## 2023-02-23 NOTE — Progress Notes (Signed)
[name] [address] [place] [address] Phone: [phone] Subjective:   Lynn [name], am serving as a scribe for Dr. Antoine Primas.  I'm seeing this patient by the request  of:  Shon Hale, MD  CC: Right hip follow-up and back pain follow-up  [id number]  01/28/2023 Patient has signs and symptoms consistent with more of a recent injury of may be gluteal tendon and causing more of a bursitis. We discussed with patient and attempted a injection today. Patient had some improvement in some of the pain but still had some weakness. Differential includes more of a lumbar radiculopathy that is concerning as well. Discussed with patient about icing regimen and home exercises, prednisone and gabapentin given as well in case the lumbar radiculopathy is playing a role. Patient will be following up with me again in 2 to 3 weeks   Update 02/24/2023 Lynn [name] is a 42 y.o. female coming in with complaint of R hip, L knee and LBP. Patient states hip is doing a little better, only pain with activity. Knee is good. Low back pain has decreased in intensity.      Past Medical History:  Diagnosis Date   COVID 08/12/2021   sore throat x 1 week all symptoms resolved   Herpes genitalia    History of palpitations 01/2021   with pregnancy resolved now per pt   Retained products of conception after miscarriage    Past Surgical History:  Procedure Laterality Date   DILATION AND EVACUATION N/A 08/27/2021   Procedure: DILATATION AND EVACUATION;  Surgeon: Olivia Mackie, MD;  Location: [facility];  Service: Gynecology;  Laterality: N/A;   LEEP  2016   OPERATIVE ULTRASOUND N/A 08/27/2021   Procedure: OPERATIVE ULTRASOUND;  Surgeon: Olivia Mackie, MD;  Location: [facility];  Service: Gynecology;  Laterality: N/A;   Social History   Socioeconomic History   Marital status: Married    Spouse name: Not on file    Number of children: 1   Years of education: Not on file   Highest education level: Not on file  Occupational History   Not on file  Tobacco Use   Smoking status: Former    Packs/day: 0.50    Years: 10.00    Additional pack years: 0.00    Total pack years: 5.00    Types: Cigarettes    Quit date: 2014    Years since quitting: 10.3   Smokeless tobacco: Never  Vaping Use   Vaping Use: Never used  Substance and Sexual Activity   Alcohol use: Never   Drug use: Never   Sexual activity: Yes  Other Topics Concern   Not on file  Social History Narrative   Not on file   Social Determinants of Health   Financial Resource Strain: Not on file  Food Insecurity: Not on file  Transportation Needs: Not on file  Physical Activity: Not on file  Stress: Not on file  Social Connections: Not on file   No Known Allergies Family History  Problem Relation Age of Onset   Atrial fibrillation Mother     Current Outpatient Medications (Endocrine & Metabolic):    predniSONE (DELTASONE) 20 MG tablet, Take 2 tablets (40 mg total) by mouth daily with breakfast.  Current Outpatient Medications (Cardiovascular):    nitroGLYCERIN (NITRO-DUR) 0.2 mg/hr patch, Apply 1/4 of a patch to skin once daily.     Current Outpatient Medications (Other):    gabapentin (  NEURONTIN) 100 MG capsule, Take 2 capsules (200 mg total) by mouth at bedtime.   Prenatal Vit-Fe Fumarate-FA (PRENATAL MULTIVITAMIN) TABS tablet, Take 1 tablet by mouth daily at 12 noon.   senna-docusate (SENOKOT-S) 8.6-50 MG tablet, Take 2 tablets by mouth daily.   valACYclovir (VALTREX) 500 MG tablet, Take 500 mg by mouth at bedtime.   Vitamin D, Ergocalciferol, (DRISDOL) 1.25 MG (50000 UNIT) CAPS capsule, Take 1 capsule (50,000 Units total) by mouth every 7 (seven) days.   Reviewed prior external information including notes and imaging from  primary care provider As well as notes that were available from care everywhere and other  healthcare systems.  Past medical history, social, surgical and family history all reviewed in electronic medical record.  No pertanent information unless stated regarding to the chief complaint.   Review of Systems:  No headache, visual changes, nausea, vomiting, diarrhea, constipation, dizziness, abdominal pain, skin rash, fevers, chills, night sweats, weight loss, swollen lymph nodes, body aches, joint swelling, chest pain, shortness of breath, mood changes. POSITIVE muscle aches  Objective  Blood pressure 112/68, pulse 74, height 5' (1.524 m), weight 126 lb (57.2 kg), SpO2 98 %, unknown if currently breastfeeding.   General: No apparent distress alert and oriented x3 mood and affect normal, dressed appropriately.  HEENT: Pupils equal, extraocular movements intact  Respiratory: Patient's speak in full sentences and does not appear short of breath  Cardiovascular: No lower extremity edema, non tender, no erythema  Right hamstring does have some very mild tenderness to palpation noted still.  Seems to be more at the ischial area.  Significant improvement in strength even from previous exam.  Still has tightness with FABER on the right compared to the left.  Limited muscular skeletal ultrasound was performed and interpreted by Antoine Primas, M   Limited ultrasound of patient's right hamstring shows that there is a good callus formation with already having reabsorption noted Impression: Interval improvement    Impression and Recommendations:     The above documentation has been reviewed and is accurate and complete Judi Saa, DO

## 2023-02-24 ENCOUNTER — Encounter: Payer: Self-pay | Admitting: Family Medicine

## 2023-02-24 ENCOUNTER — Ambulatory Visit: Payer: BC Managed Care – PPO | Admitting: Family Medicine

## 2023-02-24 ENCOUNTER — Other Ambulatory Visit: Payer: Self-pay

## 2023-02-24 VITALS — BP 112/68 | HR 74 | Ht 60.0 in | Wt 126.0 lb

## 2023-02-24 DIAGNOSIS — M7061 Trochanteric bursitis, right hip: Secondary | ICD-10-CM

## 2023-02-24 DIAGNOSIS — S76311A Strain of muscle, fascia and tendon of the posterior muscle group at thigh level, right thigh, initial encounter: Secondary | ICD-10-CM | POA: Diagnosis not present

## 2023-02-24 NOTE — Patient Instructions (Addendum)
Good to see you! Everything is healing great Keep working with ergonomics at work See you again in 6-8 weeks

## 2023-02-24 NOTE — Assessment & Plan Note (Signed)
Significant improvement already at this time.  We did discuss that there is a potential somatic dysfunction of the lumbar spine and will consider continuing osteopathic manipulation after further evaluation of next exam.  We discussed with patient that if this continues to give difficulty we would need to consider advanced imaging including MRI lumbar radiculopathy as well as the hamstring tendinopathy.  Patient will follow-up with me again in 6 to 8 weeks otherwise.  Patient is in agreement with the plan

## 2023-03-02 ENCOUNTER — Encounter: Payer: Self-pay | Admitting: Family Medicine

## 2023-03-02 ENCOUNTER — Other Ambulatory Visit: Payer: Self-pay

## 2023-03-02 DIAGNOSIS — M545 Low back pain, unspecified: Secondary | ICD-10-CM

## 2023-03-02 DIAGNOSIS — R102 Pelvic and perineal pain: Secondary | ICD-10-CM

## 2023-03-21 ENCOUNTER — Ambulatory Visit
Admission: RE | Admit: 2023-03-21 | Discharge: 2023-03-21 | Disposition: A | Discharge status: Home / Self Care | Payer: BC Managed Care – PPO | Source: Ambulatory Visit | Attending: Family Medicine | Admitting: Family Medicine

## 2023-03-21 DIAGNOSIS — R102 Pelvic and perineal pain: Secondary | ICD-10-CM

## 2023-03-21 DIAGNOSIS — M47816 Spondylosis without myelopathy or radiculopathy, lumbar region: Secondary | ICD-10-CM | POA: Diagnosis not present

## 2023-03-21 DIAGNOSIS — M545 Low back pain, unspecified: Secondary | ICD-10-CM | POA: Diagnosis not present

## 2023-03-29 ENCOUNTER — Encounter: Payer: Self-pay | Admitting: Family Medicine

## 2023-03-30 ENCOUNTER — Other Ambulatory Visit: Payer: Self-pay

## 2023-03-30 DIAGNOSIS — M5459 Other low back pain: Secondary | ICD-10-CM

## 2023-04-13 NOTE — Progress Notes (Unsigned)
[name] [address] [place] [address] Phone: [phone] Subjective:   [name], am serving as a scribe for Dr. Antoine Primas.  I'm seeing this patient by the request  of:  Shon Hale, MD  CC: Left leg and knee pain follow-up  [id number]  02/24/2023 Significant improvement already at this time.  We did discuss that there is a potential somatic dysfunction of the lumbar spine and will consider continuing osteopathic manipulation after further evaluation of next exam.  We discussed with patient that if this continues to give difficulty we would need to consider advanced imaging including MRI lumbar radiculopathy as well as the hamstring tendinopathy.  Patient will follow-up with me again in 6 to 8 weeks otherwise.  Patient is in agreement with the plan    Update 04/14/2023 Willona Delk is a 42 y.o. female coming in with complaint of R hamstring pain.  Found to have some facet arthropathy of the lower back.  Facet injection on 04/16/2023. Patient states that she feels like her back is getting worse. Bending forward also increases her pain.       Past Medical History:  Diagnosis Date   COVID 08/12/2021   sore throat x 1 week all symptoms resolved   Herpes genitalia    History of palpitations 01/2021   with pregnancy resolved now per pt   Retained products of conception after miscarriage    Past Surgical History:  Procedure Laterality Date   DILATION AND EVACUATION N/A 08/27/2021   Procedure: DILATATION AND EVACUATION;  Surgeon: Olivia Mackie, MD;  Location: [facility];  Service: Gynecology;  Laterality: N/A;   LEEP  2016   OPERATIVE ULTRASOUND N/A 08/27/2021   Procedure: OPERATIVE ULTRASOUND;  Surgeon: Olivia Mackie, MD;  Location: [facility];  Service: Gynecology;  Laterality: N/A;   Social History   Socioeconomic History   Marital status: Married    Spouse name: Not on file    Number of children: 1   Years of education: Not on file   Highest education level: Not on file  Occupational History   Not on file  Tobacco Use   Smoking status: Former    Packs/day: 0.50    Years: 10.00    Additional pack years: 0.00    Total pack years: 5.00    Types: Cigarettes    Quit date: 2014    Years since quitting: 10.5   Smokeless tobacco: Never  Vaping Use   Vaping Use: Never used  Substance and Sexual Activity   Alcohol use: Never   Drug use: Never   Sexual activity: Yes  Other Topics Concern   Not on file  Social History Narrative   Not on file   Social Determinants of Health   Financial Resource Strain: Not on file  Food Insecurity: Not on file  Transportation Needs: Not on file  Physical Activity: Not on file  Stress: Not on file  Social Connections: Not on file   No Known Allergies Family History  Problem Relation Age of Onset   Atrial fibrillation Mother     Current Outpatient Medications (Endocrine & Metabolic):    predniSONE (DELTASONE) 20 MG tablet, Take 2 tablets (40 mg total) by mouth daily with breakfast.  Current Outpatient Medications (Cardiovascular):    nitroGLYCERIN (NITRO-DUR) 0.2 mg/hr patch, Apply 1/4 of a patch to skin once daily.     Current Outpatient Medications (Other):    gabapentin (NEURONTIN) 100 MG  capsule, Take 2 capsules (200 mg total) by mouth at bedtime.   Prenatal Vit-Fe Fumarate-FA (PRENATAL MULTIVITAMIN) TABS tablet, Take 1 tablet by mouth daily at 12 noon.   senna-docusate (SENOKOT-S) 8.6-50 MG tablet, Take 2 tablets by mouth daily.   valACYclovir (VALTREX) 500 MG tablet, Take 500 mg by mouth at bedtime.   Vitamin D, Ergocalciferol, (DRISDOL) 1.25 MG (50000 UNIT) CAPS capsule, Take 1 capsule (50,000 Units total) by mouth every 7 (seven) days.   Reviewed prior external information including notes and imaging from  primary care provider As well as notes that were available from care everywhere and other  healthcare systems.  Past medical history, social, surgical and family history all reviewed in electronic medical record.  No pertanent information unless stated regarding to the chief complaint.   Review of Systems:  No headache, visual changes, nausea, vomiting, diarrhea, constipation, dizziness, abdominal pain, skin rash, fevers, chills, night sweats, weight loss, swollen lymph nodes, body aches, joint swelling, chest pain, shortness of breath, mood changes. POSITIVE muscle aches  Objective  Blood pressure 104/72, pulse 63, height 5' (1.524 m), weight 126 lb (57.2 kg), SpO2 99 %, unknown if currently breastfeeding.   General: No apparent distress alert and oriented x3 mood and affect normal, dressed appropriately.  HEENT: Pupils equal, extraocular movements intact  Respiratory: Patient's speak in full sentences and does not appear short of breath  Cardiovascular: No lower extremity edema, non tender, no erythema  Low back exam shows does have more pain with extension of the back noted. Patient does have tightness noted of the hamstrings again bilaterally.  Osteopathic findings C2 flexed rotated and side bent right C4 flexed rotated and side bent left C6 flexed rotated and side bent left T2 extended rotated and side bent right inhaled third rib T9 extended rotated and side bent left L2 flexed rotated and side bent right L4 flexed rotated and side bent right Sacrum right on right    Impression and Recommendations:    The above documentation has been reviewed and is accurate and complete Judi Saa, DO

## 2023-04-14 ENCOUNTER — Encounter: Payer: Self-pay | Admitting: Family Medicine

## 2023-04-14 ENCOUNTER — Ambulatory Visit: Payer: BC Managed Care – PPO | Admitting: Family Medicine

## 2023-04-14 VITALS — BP 104/72 | HR 63 | Ht 60.0 in | Wt 126.0 lb

## 2023-04-14 DIAGNOSIS — M9908 Segmental and somatic dysfunction of rib cage: Secondary | ICD-10-CM

## 2023-04-14 DIAGNOSIS — M9901 Segmental and somatic dysfunction of cervical region: Secondary | ICD-10-CM

## 2023-04-14 DIAGNOSIS — M9903 Segmental and somatic dysfunction of lumbar region: Secondary | ICD-10-CM

## 2023-04-14 DIAGNOSIS — M47816 Spondylosis without myelopathy or radiculopathy, lumbar region: Secondary | ICD-10-CM | POA: Diagnosis not present

## 2023-04-14 DIAGNOSIS — M9904 Segmental and somatic dysfunction of sacral region: Secondary | ICD-10-CM | POA: Diagnosis not present

## 2023-04-14 DIAGNOSIS — M9902 Segmental and somatic dysfunction of thoracic region: Secondary | ICD-10-CM

## 2023-04-14 NOTE — Assessment & Plan Note (Signed)
Patient signs and symptoms is consistent with some lumbar facet arthropathy family did notice.  We discussed potential facet injections.  Discussed which activities to do and which ones to avoid, increase activity slowly.  Discussed icing regimen and home exercises.  Patient will have the epidural and then we will follow-up afterwards to see how patient is responding.  Follow-up again in 6 to 8 weeks.

## 2023-04-14 NOTE — Patient Instructions (Signed)
Great to see you  I think you will do great  See me again in 6 weeks and enjoy the beach!

## 2023-04-16 ENCOUNTER — Inpatient Hospital Stay: Admission: RE | Admit: 2023-04-16 | Payer: BC Managed Care – PPO | Source: Ambulatory Visit

## 2023-04-16 ENCOUNTER — Other Ambulatory Visit: Payer: BC Managed Care – PPO

## 2023-04-19 ENCOUNTER — Encounter: Payer: Self-pay | Admitting: Family Medicine

## 2023-04-23 ENCOUNTER — Ambulatory Visit
Admission: RE | Admit: 2023-04-23 | Discharge: 2023-04-23 | Disposition: A | Discharge status: Home / Self Care | Payer: No Typology Code available for payment source | Source: Ambulatory Visit | Attending: Family Medicine | Admitting: Family Medicine

## 2023-04-23 DIAGNOSIS — M5459 Other low back pain: Secondary | ICD-10-CM

## 2023-04-23 MED ORDER — METHYLPREDNISOLONE ACETATE 40 MG/ML INJ SUSP (RADIOLOG
80.0000 mg | Freq: Once | INTRAMUSCULAR | Status: AC
  Administered 2023-04-23: 80 mg via INTRA_ARTICULAR

## 2023-04-23 MED ORDER — IOPAMIDOL (ISOVUE-M 200) INJECTION 41%
1.0000 mL | Freq: Once | INTRAMUSCULAR | Status: AC
  Administered 2023-04-23: 1 mL via INTRA_ARTICULAR

## 2023-04-23 NOTE — Discharge Instructions (Signed)

## 2023-04-29 ENCOUNTER — Encounter: Payer: Self-pay | Admitting: Family Medicine

## 2023-05-04 DIAGNOSIS — L821 Other seborrheic keratosis: Secondary | ICD-10-CM | POA: Diagnosis not present

## 2023-05-04 DIAGNOSIS — D2272 Melanocytic nevi of left lower limb, including hip: Secondary | ICD-10-CM | POA: Diagnosis not present

## 2023-05-04 DIAGNOSIS — D225 Melanocytic nevi of trunk: Secondary | ICD-10-CM | POA: Diagnosis not present

## 2023-05-04 DIAGNOSIS — L814 Other melanin hyperpigmentation: Secondary | ICD-10-CM | POA: Diagnosis not present

## 2023-05-05 NOTE — Progress Notes (Unsigned)
[name] [address] [place] [address] Phone: [phone] Subjective:   [name], am serving as a scribe for Dr. Antoine Primas.  I'm seeing this patient by the request  of:  Shon Hale, MD  CC: Low back pain follow-up  [id number]  04/14/2023 Patient signs and symptoms is consistent with some lumbar facet arthropathy family did notice.  We discussed potential facet injections.  Discussed which activities to do and which ones to avoid, increase activity slowly.  Discussed icing regimen and home exercises.  Patient will have the epidural and then we will follow-up afterwards to see how patient is responding.  Follow-up again in 6 to 8 weeks      Update 05/06/2023 Lilas Diefendorf is a 42 y.o. female coming in with complaint of facet arthopathy. Epidural on 04/23/2023 but pain came back after a few days. Patient states that she is able to touch her toes where she was unable to touch knees prior to injection. Pain is worse in R glute. Developed pain in L glute as well since epidural. Denies any radiating symptoms. Pain is sharp even at rest.       Past Medical History:  Diagnosis Date   COVID 08/12/2021   sore throat x 1 week all symptoms resolved   Herpes genitalia    History of palpitations 01/2021   with pregnancy resolved now per pt   Retained products of conception after miscarriage    Past Surgical History:  Procedure Laterality Date   DILATION AND EVACUATION N/A 08/27/2021   Procedure: DILATATION AND EVACUATION;  Surgeon: Olivia Mackie, MD;  Location: [facility];  Service: Gynecology;  Laterality: N/A;   LEEP  2016   OPERATIVE ULTRASOUND N/A 08/27/2021   Procedure: OPERATIVE ULTRASOUND;  Surgeon: Olivia Mackie, MD;  Location: [facility];  Service: Gynecology;  Laterality: N/A;   Social History   Socioeconomic History   Marital status: Married    Spouse name: Not on file    Number of children: 1   Years of education: Not on file   Highest education level: Not on file  Occupational History   Not on file  Tobacco Use   Smoking status: Former    Current packs/day: 0.00    Average packs/day: 0.5 packs/day for 10.0 years (5.0 ttl pk-yrs)    Types: Cigarettes    Start date: 2004    Quit date: 2014    Years since quitting: 10.5   Smokeless tobacco: Never  Vaping Use   Vaping status: Never Used  Substance and Sexual Activity   Alcohol use: Never   Drug use: Never   Sexual activity: Yes  Other Topics Concern   Not on file  Social History Narrative   Not on file   Social Determinants of Health   Financial Resource Strain: Not on file  Food Insecurity: Not on file  Transportation Needs: Not on file  Physical Activity: Not on file  Stress: Not on file  Social Connections: Not on file   No Known Allergies Family History  Problem Relation Age of Onset   Atrial fibrillation Mother     Current Outpatient Medications (Endocrine & Metabolic):    predniSONE (DELTASONE) 20 MG tablet, Take 2 tablets (40 mg total) by mouth daily with breakfast.  Current Outpatient Medications (Cardiovascular):    nitroGLYCERIN (NITRO-DUR) 0.2 mg/hr patch, Apply 1/4 of a patch to skin once daily.     Current Outpatient Medications (Other):  gabapentin (NEURONTIN) 100 MG capsule, Take 2 capsules (200 mg total) by mouth at bedtime.   Prenatal Vit-Fe Fumarate-FA (PRENATAL MULTIVITAMIN) TABS tablet, Take 1 tablet by mouth daily at 12 noon.   senna-docusate (SENOKOT-S) 8.6-50 MG tablet, Take 2 tablets by mouth daily.   valACYclovir (VALTREX) 500 MG tablet, Take 500 mg by mouth at bedtime.   Vitamin D, Ergocalciferol, (DRISDOL) 1.25 MG (50000 UNIT) CAPS capsule, Take 1 capsule (50,000 Units total) by mouth every 7 (seven) days.   Reviewed prior external information including notes and imaging from  primary care provider As well as notes that were available from care  everywhere and other healthcare systems.  Past medical history, social, surgical and family history all reviewed in electronic medical record.  No pertanent information unless stated regarding to the chief complaint.   Review of Systems:  No headache, visual changes, nausea, vomiting, diarrhea, constipation, dizziness, abdominal pain, skin rash, fevers, chills, night sweats, weight loss, swollen lymph nodes, body aches, joint swelling, chest pain, shortness of breath, mood changes. POSITIVE muscle aches  Objective  Blood pressure 98/62, pulse 70, height 5\' 5"  (1.651 m), weight 124 lb (56.2 kg), SpO2 98%, unknown if currently breastfeeding.   General: No apparent distress alert and oriented x3 mood and affect normal, dressed appropriately.  HEENT: Pupils equal, extraocular movements intact  Respiratory: Patient's speak in full sentences and does not appear short of breath  Cardiovascular: No lower extremity edema, non tender, no erythema  Low back exam does have some mild loss of lordosis but does have some improvement in range of motion.  Worsening pain and radicular symptoms with extension of the back right greater than left.    Impression and Recommendations:    The above documentation has been reviewed and is accurate and complete Judi Saa, DO

## 2023-05-06 ENCOUNTER — Encounter: Payer: Self-pay | Admitting: Family Medicine

## 2023-05-06 ENCOUNTER — Ambulatory Visit: Payer: BC Managed Care – PPO | Admitting: Family Medicine

## 2023-05-06 VITALS — BP 98/62 | HR 70 | Ht 65.0 in | Wt 124.0 lb

## 2023-05-06 DIAGNOSIS — M545 Low back pain, unspecified: Secondary | ICD-10-CM

## 2023-05-06 DIAGNOSIS — M47816 Spondylosis without myelopathy or radiculopathy, lumbar region: Secondary | ICD-10-CM | POA: Diagnosis not present

## 2023-05-06 NOTE — Patient Instructions (Signed)
R MBB L4/L5 [phone] See how you respond See me 6-8 weeks after ablation if the MBB works

## 2023-05-06 NOTE — Assessment & Plan Note (Signed)
Patient initially did improve significantly when it comes to the range of motion but unfortunately continues to have pain in the back.  Do feel that a medial branch block could be beneficial.  Patient has failed all the conservative therapy including 12 weeks private physical therapy, home exercises, oral anti-inflammatories.  Did get improvement with range of motion with the facet injections.  Do feel that a medial branch blocks and possibly radiofrequency ablation would be significantly helpful.  Patient will be scheduled and we will see how patient responds and follow-up 6 weeks after injections.

## 2023-05-11 ENCOUNTER — Encounter: Payer: Self-pay | Admitting: Family Medicine

## 2023-05-21 ENCOUNTER — Encounter: Payer: Self-pay | Admitting: Family Medicine

## 2023-05-26 ENCOUNTER — Ambulatory Visit: Payer: BC Managed Care – PPO | Admitting: Family Medicine

## 2023-05-27 ENCOUNTER — Encounter: Payer: Self-pay | Admitting: Family Medicine

## 2023-05-28 ENCOUNTER — Ambulatory Visit
Admission: RE | Admit: 2023-05-28 | Discharge: 2023-05-28 | Disposition: A | Discharge status: Home / Self Care | Payer: BC Managed Care – PPO | Source: Ambulatory Visit | Attending: Family Medicine | Admitting: Family Medicine

## 2023-05-28 ENCOUNTER — Other Ambulatory Visit: Payer: Self-pay | Admitting: Family Medicine

## 2023-05-28 DIAGNOSIS — M79659 Pain in unspecified thigh: Secondary | ICD-10-CM | POA: Diagnosis not present

## 2023-05-28 DIAGNOSIS — M545 Low back pain, unspecified: Secondary | ICD-10-CM

## 2023-05-28 DIAGNOSIS — M79604 Pain in right leg: Secondary | ICD-10-CM | POA: Diagnosis not present

## 2023-05-28 NOTE — Discharge Instructions (Signed)
Medial Branch Block Discharge Instructions  Take over-the-counter and prescription medicines only as told by your health care provider.  Do not drive the day of your procedure  Return to your normal activities as told by your health care provider.   If injection site is sore you may ice the area for 20 minutes, 2-3 times a day.   Check your injection site every day for signs of infection. Check for: Redness, swelling, or pain. Fluid or blood. Warmth. Pus or a bad smell.  Please contact our office at [phone] if: You have a fever or chills. You have any signs of infection. You develop any numbness or weakness.   Thank you for visiting our office.  

## 2023-05-31 ENCOUNTER — Encounter: Payer: Self-pay | Admitting: Family Medicine

## 2023-05-31 ENCOUNTER — Other Ambulatory Visit: Payer: Self-pay | Admitting: Family Medicine

## 2023-05-31 DIAGNOSIS — M5459 Other low back pain: Secondary | ICD-10-CM

## 2023-06-09 ENCOUNTER — Telehealth: Payer: Self-pay

## 2023-06-09 NOTE — Telephone Encounter (Signed)
Facet injection is denied for reason stated below  Your doctor told us that you have back pain coming from the small joints of your lower back. Your doctor wants you to have a procedure to decrease your pain. This procedure uses a radio wave to heat up a small area of nerve tissue. It can give you long term pain relief.   This procedure is needed if you had two separate injections of numbing medicine to confirm that your pain is coming from that area in the last 6 months. The injections should stop most of your pain to be sure that your pain is coming from that area. This radio wave procedure can be done if both injections stopped most of your pain. We reviewed the notes we received. The notes do not show that you had two such injections in the last 6 months. As a result, this procedure is not medically necessary.   I am not sure what exactly they mean by the bold part. I see patient has had previous facet injections approved, but I feel like that is not what it is talking about?

## 2023-06-10 NOTE — Telephone Encounter (Signed)
It looks like this was already approved and she had it on 05/28/23. I saw another order in her chart so I thought she was getting another one after this one, but I think she is fine. Could you look behind me to make sure this is correct. The denial I received was a mail copy so that soul of just taken forever to get to [place].

## 2023-06-16 ENCOUNTER — Other Ambulatory Visit: Payer: Self-pay | Admitting: Family Medicine

## 2023-06-16 ENCOUNTER — Ambulatory Visit: Payer: BC Managed Care – PPO | Admitting: Family Medicine

## 2023-06-16 VITALS — BP 112/74 | HR 66 | Ht 65.0 in | Wt 126.0 lb

## 2023-06-16 DIAGNOSIS — M9904 Segmental and somatic dysfunction of sacral region: Secondary | ICD-10-CM

## 2023-06-16 DIAGNOSIS — M9903 Segmental and somatic dysfunction of lumbar region: Secondary | ICD-10-CM

## 2023-06-16 DIAGNOSIS — M9902 Segmental and somatic dysfunction of thoracic region: Secondary | ICD-10-CM | POA: Diagnosis not present

## 2023-06-16 DIAGNOSIS — M9901 Segmental and somatic dysfunction of cervical region: Secondary | ICD-10-CM | POA: Diagnosis not present

## 2023-06-16 DIAGNOSIS — M2550 Pain in unspecified joint: Secondary | ICD-10-CM | POA: Diagnosis not present

## 2023-06-16 DIAGNOSIS — M9908 Segmental and somatic dysfunction of rib cage: Secondary | ICD-10-CM

## 2023-06-16 DIAGNOSIS — M47816 Spondylosis without myelopathy or radiculopathy, lumbar region: Secondary | ICD-10-CM

## 2023-06-16 DIAGNOSIS — M5410 Radiculopathy, site unspecified: Secondary | ICD-10-CM | POA: Diagnosis not present

## 2023-06-16 LAB — CBC WITH DIFFERENTIAL/PLATELET
Basophils Absolute: 0 10*3/uL (ref 0.0–0.1)
Basophils Relative: 0.5 % (ref 0.0–3.0)
Eosinophils Absolute: 0.1 10*3/uL (ref 0.0–0.7)
Eosinophils Relative: 1.1 % (ref 0.0–5.0)
HCT: 42.7 % (ref 36.0–46.0)
Hemoglobin: 14.1 g/dL (ref 12.0–15.0)
Lymphocytes Relative: 23.6 % (ref 12.0–46.0)
Lymphs Abs: 1.4 10*3/uL (ref 0.7–4.0)
MCHC: 33.1 g/dL (ref 30.0–36.0)
MCV: 98.6 fl (ref 78.0–100.0)
Monocytes Absolute: 0.5 10*3/uL (ref 0.1–1.0)
Monocytes Relative: 8.9 % (ref 3.0–12.0)
Neutro Abs: 3.9 10*3/uL (ref 1.4–7.7)
Neutrophils Relative %: 65.9 % (ref 43.0–77.0)
Platelets: 180 10*3/uL (ref 150.0–400.0)
RBC: 4.33 Mil/uL (ref 3.87–5.11)
RDW: 11.6 % (ref 11.5–15.5)
WBC: 6 10*3/uL (ref 4.0–10.5)

## 2023-06-16 LAB — C-REACTIVE PROTEIN: CRP: <1 mg/dL (ref 0.5–20.0)

## 2023-06-16 LAB — COMPREHENSIVE METABOLIC PANEL
ALT: 21 U/L (ref 0–35)
AST: 20 U/L (ref 0–37)
Albumin: 4.1 g/dL (ref 3.5–5.2)
Alkaline Phosphatase: 36 U/L — ABNORMAL LOW (ref 39–117)
BUN: 22 mg/dL (ref 6–23)
CO2: 30 meq/L (ref 19–32)
Calcium: 9.1 mg/dL (ref 8.4–10.5)
Chloride: 103 meq/L (ref 96–112)
Creatinine, Ser: 1.16 mg/dL (ref 0.40–1.20)
GFR: 59.13 mL/min — ABNORMAL LOW (ref >60.00)
Glucose, Bld: 92 mg/dL (ref 70–99)
Potassium: 3.5 meq/L (ref 3.5–5.1)
Sodium: 139 meq/L (ref 135–145)
Total Bilirubin: 0.4 mg/dL (ref 0.2–1.2)
Total Protein: 6.6 g/dL (ref 6.0–8.3)

## 2023-06-16 LAB — VITAMIN D 25 HYDROXY (VIT D DEFICIENCY, FRACTURES): VITD: 39.61 ng/mL (ref 30.00–100.00)

## 2023-06-16 LAB — FERRITIN: Ferritin: 29.2 ng/mL (ref 10.0–291.0)

## 2023-06-16 LAB — IBC PANEL
Iron: 61 ug/dL (ref 42–145)
Saturation Ratios: 19.5 % — ABNORMAL LOW (ref 20.0–50.0)
TIBC: 313.6 ug/dL (ref 250.0–450.0)
Transferrin: 224 mg/dL (ref 212.0–360.0)

## 2023-06-16 LAB — SEDIMENTATION RATE: Sed Rate: 1 mm/h (ref 0–20)

## 2023-06-16 LAB — URIC ACID: Uric Acid, Serum: 4.4 mg/dL (ref 2.4–7.0)

## 2023-06-16 LAB — VITAMIN B12: Vitamin B-12: 419 pg/mL (ref 211–911)

## 2023-06-16 LAB — TSH: TSH: 0.86 u[IU]/mL (ref 0.35–5.50)

## 2023-06-16 NOTE — Progress Notes (Unsigned)
[name] [address] [place] [address] Phone: [phone] Subjective:   Lynn [name], am serving as a scribe for Dr. Antoine Primas.  I'm seeing this patient by the request  of:  Lynn Hale, MD  CC: Back pain follow-up  [id number]  Lynn [name] is a 42 y.o. female coming in with complaint of back pain. Facet injection 05/28/2023. Patient states that her pain was gone for one month. R leg pain never improved.     Past Medical History:  Diagnosis Date   COVID 08/12/2021   sore throat x 1 week all symptoms resolved   Herpes genitalia    History of palpitations 01/2021   with pregnancy resolved now per pt   Retained products of conception after miscarriage    Past Surgical History:  Procedure Laterality Date   DILATION AND EVACUATION N/A 08/27/2021   Procedure: DILATATION AND EVACUATION;  Surgeon: Olivia Mackie, MD;  Location: [facility];  Service: Gynecology;  Laterality: N/A;   LEEP  2016   OPERATIVE ULTRASOUND N/A 08/27/2021   Procedure: OPERATIVE ULTRASOUND;  Surgeon: Olivia Mackie, MD;  Location: [facility];  Service: Gynecology;  Laterality: N/A;   Social History   Socioeconomic History   Marital status: Married    Spouse name: Not on file   Number of children: 1   Years of education: Not on file   Highest education level: Not on file  Occupational History   Not on file  Tobacco Use   Smoking status: Former    Current packs/day: 0.00    Average packs/day: 0.5 packs/day for 10.0 years (5.0 ttl pk-yrs)    Types: Cigarettes    Start date: 2004    Quit date: 2014    Years since quitting: 10.6   Smokeless tobacco: Never  Vaping Use   Vaping status: Never Used  Substance and Sexual Activity   Alcohol use: Never   Drug use: Never   Sexual activity: Yes  Other Topics Concern   Not on file  Social History Narrative   Not on file   Social Determinants of Health    Financial Resource Strain: Not on file  Food Insecurity: Not on file  Transportation Needs: Not on file  Physical Activity: Not on file  Stress: Not on file  Social Connections: Not on file   No Known Allergies Family History  Problem Relation Age of Onset   Atrial fibrillation Mother     Current Outpatient Medications (Endocrine & Metabolic):    predniSONE (DELTASONE) 20 MG tablet, Take 2 tablets (40 mg total) by mouth daily with breakfast.  Current Outpatient Medications (Cardiovascular):    nitroGLYCERIN (NITRO-DUR) 0.2 mg/hr patch, Apply 1/4 of a patch to skin once daily.     Current Outpatient Medications (Other):    gabapentin (NEURONTIN) 100 MG capsule, Take 2 capsules (200 mg total) by mouth at bedtime.   Prenatal Vit-Fe Fumarate-FA (PRENATAL MULTIVITAMIN) TABS tablet, Take 1 tablet by mouth daily at 12 noon.   senna-docusate (SENOKOT-S) 8.6-50 MG tablet, Take 2 tablets by mouth daily.   valACYclovir (VALTREX) 500 MG tablet, Take 500 mg by mouth at bedtime.   Vitamin D, Ergocalciferol, (DRISDOL) 1.25 MG (50000 UNIT) CAPS capsule, Take 1 capsule (50,000 Units total) by mouth every 7 (seven) days.   Reviewed prior external information including notes and imaging from  primary care provider As well as notes that were available from care everywhere and other healthcare systems.  Past medical history, social, surgical and family history all reviewed in electronic medical record.  No pertanent information unless stated regarding to the chief complaint.   Review of Systems:  No headache, visual changes, nausea, vomiting, diarrhea, constipation, dizziness, abdominal pain, skin rash, fevers, chills, night sweats, weight loss, swollen lymph nodes, body aches, joint swelling, chest pain, shortness of breath, mood changes. POSITIVE muscle aches, body aches mostly in the right leg  Objective  Blood pressure 112/74, pulse 66, height 5\' 5"  (1.651 m), weight 126 lb (57.2 kg), SpO2  99%, unknown if currently breastfeeding.   General: No apparent distress alert and oriented x3 mood and affect normal, dressed appropriately.  HEENT: Pupils equal, extraocular movements intact  Respiratory: Patient's speak in full sentences and does not appear short of breath  Cardiovascular: No lower extremity edema, non tender, no erythema  Low back exam does have some tightness noted with straight leg test.  Does have tightness with FABER test noted.  Osteopathic findings C2 flexed rotated and side bent right C4 flexed rotated and side bent left C6 flexed rotated and side bent left T3 extended rotated and side bent right inhaled third rib T9 extended rotated and side bent left L2 flexed rotated and side bent right L5 flexed rotated and side bent left Sacrum right on right     Impression and Recommendations:     Facet arthropathy, lumbar Known facet arthropathy.  Has responded well to medial branch block.  Thought patient would be able to get an ablation and even with patient having undergone facet injection previously and also 90% improvement initially.  Patient's insurance is stating that another medial branch block is necessary.  Will get that and then see if she will be a candidate for the radiofrequency ablation which I do think would be significantly helpful for this individual's back pain.  In addition to this though we did discuss different medications that could be beneficial.  Patient is doing relatively well at the moment.  Will get laboratory workup to make sure nothing else is contributing to some of the other aches and pains and patient's leg pain.  Do believe with patient continuing to have the radicular symptoms we should consider the possibility of a nerve conduction study and ABI to make sure there is no neurovascular compromise of the lower extremity itself or help [place] with the diagnosis of the lumbar radiculopathy.  Follow-up with me again in 6 to 8 weeks otherwise       Decision today to treat with OMT was based on Physical Exam  After verbal consent patient was treated with HVLA, ME, FPR techniques in cervical, thoracic, rib, lumbar and sacral areas, all areas are chronic   Patient tolerated the procedure well with improvement in symptoms  Patient given exercises, stretches and lifestyle modifications  See medications in patient instructions if given  Patient will follow up in 4-8 weeks

## 2023-06-16 NOTE — Patient Instructions (Addendum)
All labs ABI B LE [phone] Nerve Conduction Study LE at [facility] Same Medial branch blocks [phone] See you again in 6-8 weeks

## 2023-06-17 ENCOUNTER — Encounter: Payer: Self-pay | Admitting: Family Medicine

## 2023-06-17 ENCOUNTER — Other Ambulatory Visit: Payer: Self-pay | Admitting: Family Medicine

## 2023-06-17 LAB — HOUSE ACCOUNT TRACKING

## 2023-06-17 NOTE — Assessment & Plan Note (Signed)
Known facet arthropathy.  Has responded well to medial branch block.  Thought patient would be able to get an ablation and even with patient having undergone facet injection previously and also 90% improvement initially.  Patient's insurance is stating that another medial branch block is necessary.  Will get that and then see if she will be a candidate for the radiofrequency ablation which I do think would be significantly helpful for this individual's back pain.  In addition to this though we did discuss different medications that could be beneficial.  Patient is doing relatively well at the moment.  Will get laboratory workup to make sure nothing else is contributing to some of the other aches and pains and patient's leg pain.  Do believe with patient continuing to have the radicular symptoms we should consider the possibility of a nerve conduction study and ABI to make sure there is no neurovascular compromise of the lower extremity itself or help [place] with the diagnosis of the lumbar radiculopathy.  Follow-up with me again in 6 to 8 weeks otherwise

## 2023-06-20 LAB — CYCLIC CITRUL PEPTIDE ANTIBODY, IGG: Cyclic Citrullin Peptide Ab: <16 U

## 2023-06-20 LAB — PTH, INTACT AND CALCIUM
Calcium: 9.9 mg/dL (ref 8.6–10.2)
PTH: 16 pg/mL (ref 16–77)

## 2023-06-20 LAB — HOUSE ACCOUNT TRACKING

## 2023-06-20 LAB — RHEUMATOID FACTOR: Rheumatoid fact SerPl-aCnc: <10 [IU]/mL (ref <14)

## 2023-06-20 LAB — ANGIOTENSIN CONVERTING ENZYME: Angiotensin-Converting Enzyme: 15 U/L (ref 9–67)

## 2023-06-20 LAB — CALCIUM, IONIZED: Calcium, Ion: 5.1 mg/dL (ref 4.7–5.5)

## 2023-06-20 LAB — ANA: Anti Nuclear Antibody (ANA): NEGATIVE

## 2023-06-22 ENCOUNTER — Encounter: Payer: Self-pay | Admitting: Neurology

## 2023-06-23 ENCOUNTER — Other Ambulatory Visit: Payer: Self-pay

## 2023-06-23 DIAGNOSIS — R202 Paresthesia of skin: Secondary | ICD-10-CM

## 2023-06-24 ENCOUNTER — Ambulatory Visit: Payer: BC Managed Care – PPO | Admitting: Family Medicine

## 2023-06-25 ENCOUNTER — Ambulatory Visit (HOSPITAL_COMMUNITY)
Admission: RE | Admit: 2023-06-25 | Discharge: 2023-06-25 | Disposition: A | Discharge status: Home / Self Care | Payer: BC Managed Care – PPO | Source: Ambulatory Visit | Attending: Cardiology | Admitting: Cardiology

## 2023-06-25 DIAGNOSIS — M79604 Pain in right leg: Secondary | ICD-10-CM | POA: Insufficient documentation

## 2023-06-25 DIAGNOSIS — M5410 Radiculopathy, site unspecified: Secondary | ICD-10-CM | POA: Diagnosis not present

## 2023-06-27 LAB — VAS US LOWER EXT ART SEG MULTI (SEGMENTALS & LE RAYNAUDS)
Left ABI: 1.11
Right ABI: 1.12

## 2023-06-28 ENCOUNTER — Other Ambulatory Visit: Payer: Self-pay | Admitting: Family Medicine

## 2023-06-28 ENCOUNTER — Encounter: Payer: Self-pay | Admitting: Family Medicine

## 2023-06-28 DIAGNOSIS — M5416 Radiculopathy, lumbar region: Secondary | ICD-10-CM

## 2023-07-08 NOTE — Discharge Instructions (Signed)
Medial Branch Block Discharge Instructions  Take over-the-counter and prescription medicines only as told by your health care provider.  Do not drive the day of your procedure  Return to your normal activities as told by your health care provider.   If injection site is sore you may ice the area for 20 minutes, 2-3 times a day.   Check your injection site every day for signs of infection. Check for: Redness, swelling, or pain. Fluid or blood. Warmth. Pus or a bad smell.  Please contact our office at [phone] if: You have a fever or chills. You have any signs of infection. You develop any numbness or weakness.   Thank you for visiting our office.

## 2023-07-09 ENCOUNTER — Other Ambulatory Visit: Payer: Self-pay | Admitting: Family Medicine

## 2023-07-09 ENCOUNTER — Ambulatory Visit
Admission: RE | Admit: 2023-07-09 | Discharge: 2023-07-09 | Disposition: A | Discharge status: Home / Self Care | Payer: BC Managed Care – PPO | Source: Ambulatory Visit | Attending: Family Medicine | Admitting: Family Medicine

## 2023-07-09 DIAGNOSIS — M5416 Radiculopathy, lumbar region: Secondary | ICD-10-CM

## 2023-07-09 DIAGNOSIS — M5410 Radiculopathy, site unspecified: Secondary | ICD-10-CM

## 2023-07-09 DIAGNOSIS — M545 Low back pain, unspecified: Secondary | ICD-10-CM | POA: Diagnosis not present

## 2023-07-09 DIAGNOSIS — M79651 Pain in right thigh: Secondary | ICD-10-CM | POA: Diagnosis not present

## 2023-07-20 DIAGNOSIS — L708 Other acne: Secondary | ICD-10-CM | POA: Diagnosis not present

## 2023-07-22 ENCOUNTER — Ambulatory Visit: Payer: BC Managed Care – PPO | Admitting: Neurology

## 2023-07-22 DIAGNOSIS — M79604 Pain in right leg: Secondary | ICD-10-CM

## 2023-07-22 DIAGNOSIS — R202 Paresthesia of skin: Secondary | ICD-10-CM

## 2023-07-22 NOTE — Procedures (Signed)
  [facility]  [address] [place], [address]  [place], [place] [address] Tel: [phone] Fax: [phone] Test Date:  07/22/2023  Patient: Lynn [name] DOB: [birth year] Physician: Nita Sickle, DO  Sex: Female Height: 5\' 5"  Ref Phys: Antoine Primas, DO  ID#: [id number]   Technician:    History: This is a 42 year old female referred for evaluation of right leg pain.  NCV & EMG Findings: Extensive electrodiagnostic testing of the right lower extremity shows:  Right sural and superficial peroneal sensory responses are within normal limits. Right peroneal and tibial motor responses are within normal limits. Right tibial H reflex studies are within normal limits. There is no evidence of active or chronic motor axonal loss changes affecting any of the tested muscles.  Motor unit configuration and recruitment pattern is within normal limits.  Impression: This is a normal study of the right lower extremity.  In particular, there is no evidence of a large fiber sensorimotor polyneuropathy or lumbosacral radiculopathy.   ___________________________ Nita Sickle, DO    Nerve Conduction Studies   Stim Site NR Peak (ms) Norm Peak (ms) O-P Amp (V) Norm O-P Amp  Right Sup Peroneal Anti Sensory (Ant Lat Mall)  32 C  12 cm    2.1 <4.5 13.7 >5  Right Sural Anti Sensory (Lat Mall)  32 C  Calf    2.9 <4.5 19.2 >5     Stim Site NR Onset (ms) Norm Onset (ms) O-P Amp (mV) Norm O-P Amp Site1 Site2 Delta-0 (ms) Dist (cm) Vel (m/s) Norm Vel (m/s)  Right Peroneal Motor (Ext Dig Brev)  32 C  Ankle    2.5 <5.5 7.5 >3 B Fib Ankle 7.0 38.0 54 >40  B Fib    9.5  7.1  Poplt B Fib 1.4 8.0 57 >40  Poplt    10.9  6.9         Right Tibial Motor (Abd Hall Brev)  32 C  Ankle    3.3 <6.0 13.6 >8 Knee Ankle 7.3 40.0 55 >40  Knee    10.6  9.8          Electromyography   Side Muscle Ins.Act Fibs Fasc Recrt Amp Dur Poly Activation Comment  Right AntTibialis Nml Nml Nml Nml Nml Nml Nml Nml N/A   Right Gastroc Nml Nml Nml Nml Nml Nml Nml Nml N/A  Right Flex Dig Long Nml Nml Nml Nml Nml Nml Nml Nml N/A  Right RectFemoris Nml Nml Nml Nml Nml Nml Nml Nml N/A  Right BicepsFemS Nml Nml Nml Nml Nml Nml Nml Nml N/A  Right GluteusMed Nml Nml Nml Nml Nml Nml Nml Nml N/A      Waveforms:

## 2023-07-22 NOTE — Progress Notes (Signed)
[name] [address] [place] [address] Phone: [phone] Subjective:   Lynn [name], am serving as a scribe for Dr. Antoine Primas.  I'm seeing this patient by the request  of:  Shon Hale, MD  CC: Back and neck pain follow-up  [id number]  Lynn [name] is a 42 y.o. female coming in with complaint of back and neck pain. OMT 06/16/2023. Facet injections on 07/09/2023. Patient states hasn't had RFA yet. Severe leg pain is getting worse intensity wise. No other concerns.  Medications patient has been prescribed: None  Taking:         Reviewed prior external information including notes and imaging from previsou exam, outside providers and external EMR if available.   As well as notes that were available from care everywhere and other healthcare systems.  Past medical history, social, surgical and family history all reviewed in electronic medical record.  No pertanent information unless stated regarding to the chief complaint.   Past Medical History:  Diagnosis Date   COVID 08/12/2021   sore throat x 1 week all symptoms resolved   Herpes genitalia    History of palpitations 01/2021   with pregnancy resolved now per pt   Retained products of conception after miscarriage     No Known Allergies   Review of Systems:  No headache, visual changes, nausea, vomiting, diarrhea, constipation, dizziness, abdominal pain, skin rash, fevers, chills, night sweats, weight loss, swollen lymph nodes, body aches, joint swelling, chest pain, shortness of breath, mood changes. POSITIVE muscle aches  Objective  Blood pressure 106/72, pulse 78, height 5\' 5"  (1.651 m), weight 125 lb (56.7 kg), SpO2 98%, unknown if currently breastfeeding.   General: No apparent distress alert and oriented x3 mood and affect normal, dressed appropriately.  HEENT: Pupils equal, extraocular movements intact  Respiratory: Patient's speak in full sentences and  does not appear short of breath  Cardiovascular: No lower extremity edema, non tender, no erythema  Tightness noted of the right hip area.  Seems to be fairly severe overall.  Difficulty and severe tenderness over the FABER test as well as over the piriformis muscle.  Does have some tightness of the right hamstring compared to the contralateral side.  Procedure: Real-time Ultrasound Guided Injection of right piriformis tendon sheath proximally Device: GE Logiq Q7 Ultrasound guided injection is preferred based studies that show increased duration, increased effect, greater accuracy, decreased procedural pain, increased response rate, and decreased cost with ultrasound guided versus blind injection.  Verbal informed consent obtained.  Time-out conducted.  Noted no overlying erythema, induration, or other signs of local infection.  Skin prepped in a sterile fashion.  Local anesthesia: Topical Ethyl chloride.  With sterile technique and under real time ultrasound guidance: With a 21-gauge 2 inch needle injected with 1 cc of 0.5% Marcaine and 1 cc of Kenalog 40 mg/mL Completed without difficulty  Pain immediately improved suggesting accurate placement of the medication.  Advised to call if fevers/chills, erythema, induration, drainage, or persistent bleeding.  Impression: Technically successful ultrasound guided injection.       Assessment and Plan:  Piriformis syndrome of right side Patient does have what appears to be more of a piriformis syndrome.  Given injection today, tolerated the procedure relatively well.  Differential includes the possibility of hamstring difficulty again but did not see anything on ultrasound today that would make me think otherwise.  Discussed icing regimen and home exercises, discussed avoiding certain activities.  Discussed heel  lifts, compression sleeves follow-up again in 6 to 8 weeks otherwise.         The above documentation has been reviewed and is  accurate and complete Judi Saa, DO         Note: This dictation was prepared with Dragon dictation along with smaller phrase technology. Any transcriptional errors that result from this process are unintentional.

## 2023-07-28 ENCOUNTER — Encounter: Payer: Self-pay | Admitting: Family Medicine

## 2023-07-28 ENCOUNTER — Other Ambulatory Visit: Payer: Self-pay

## 2023-07-28 ENCOUNTER — Ambulatory Visit: Payer: BC Managed Care – PPO | Admitting: Family Medicine

## 2023-07-28 VITALS — BP 106/72 | HR 78 | Ht 65.0 in | Wt 125.0 lb

## 2023-07-28 DIAGNOSIS — G5701 Lesion of sciatic nerve, right lower limb: Secondary | ICD-10-CM

## 2023-07-28 DIAGNOSIS — M5410 Radiculopathy, site unspecified: Secondary | ICD-10-CM

## 2023-07-28 NOTE — Assessment & Plan Note (Signed)
Patient does have what appears to be more of a piriformis syndrome.  Given injection today, tolerated the procedure relatively well.  Differential includes the possibility of hamstring difficulty again but did not see anything on ultrasound today that would make me think otherwise.  Discussed icing regimen and home exercises, discussed avoiding certain activities.  Discussed heel lifts, compression sleeves follow-up again in 6 to 8 weeks otherwise.

## 2023-07-28 NOTE — Patient Instructions (Addendum)
Injection in piriformis today Consider compression sleeve 1/8" heel lift in shoes Run like a Geisha See you again in 6 weeks

## 2023-09-15 NOTE — Progress Notes (Unsigned)
[name] [address] [place] [address] Phone: [phone] Subjective:   [name], am serving as a scribe for Dr. Antoine Primas.  I'm seeing this patient by the request  of:  Shon Hale, MD  CC: Low back pain  [id number]  10/16/20204 Patient does have what appears to be more of a piriformis syndrome.  Given injection today, tolerated the procedure relatively well.  Differential includes the possibility of hamstring difficulty again but did not see anything on ultrasound today that would make me think otherwise.  Discussed icing regimen and home exercises, discussed avoiding certain activities.  Discussed heel lifts, compression sleeves follow-up again in 6 to 8 weeks otherwise.      Update 09/16/2023 Shameca Norgren is a 42 y.o. female coming in with complaint of R hip pain. Patient states that she is improving. Piriformis injection did help a lot but hamstring is still painful. Now having anterior  hip pain in L hip with squatting.        Past Medical History:  Diagnosis Date   COVID 08/12/2021   sore throat x 1 week all symptoms resolved   Herpes genitalia    History of palpitations 01/2021   with pregnancy resolved now per pt   Retained products of conception after miscarriage    Past Surgical History:  Procedure Laterality Date   DILATION AND EVACUATION N/A 08/27/2021   Procedure: DILATATION AND EVACUATION;  Surgeon: Olivia Mackie, MD;  Location: [facility];  Service: Gynecology;  Laterality: N/A;   LEEP  2016   OPERATIVE ULTRASOUND N/A 08/27/2021   Procedure: OPERATIVE ULTRASOUND;  Surgeon: Olivia Mackie, MD;  Location: [facility];  Service: Gynecology;  Laterality: N/A;   Social History   Socioeconomic History   Marital status: Married    Spouse name: Not on file   Number of children: 1   Years of education: Not on file   Highest education level: Not on file   Occupational History   Not on file  Tobacco Use   Smoking status: Former    Current packs/day: 0.00    Average packs/day: 0.5 packs/day for 10.0 years (5.0 ttl pk-yrs)    Types: Cigarettes    Start date: 2004    Quit date: 2014    Years since quitting: 10.9   Smokeless tobacco: Never  Vaping Use   Vaping status: Never Used  Substance and Sexual Activity   Alcohol use: Never   Drug use: Never   Sexual activity: Yes  Other Topics Concern   Not on file  Social History Narrative   Not on file   Social Determinants of Health   Financial Resource Strain: Not on file  Food Insecurity: Not on file  Transportation Needs: Not on file  Physical Activity: Not on file  Stress: Not on file  Social Connections: Not on file   No Known Allergies Family History  Problem Relation Age of Onset   Atrial fibrillation Mother     Current Outpatient Medications (Endocrine & Metabolic):    predniSONE (DELTASONE) 20 MG tablet, Take 2 tablets (40 mg total) by mouth daily with breakfast.  Current Outpatient Medications (Cardiovascular):    nitroGLYCERIN (NITRO-DUR) 0.2 mg/hr patch, Apply 1/4 of a patch to skin once daily.     Current Outpatient Medications (Other):    gabapentin (NEURONTIN) 100 MG capsule, Take 2 capsules (200 mg total) by mouth at bedtime.   Prenatal Vit-Fe Fumarate-FA (PRENATAL MULTIVITAMIN)  TABS tablet, Take 1 tablet by mouth daily at 12 noon.   senna-docusate (SENOKOT-S) 8.6-50 MG tablet, Take 2 tablets by mouth daily.   valACYclovir (VALTREX) 500 MG tablet, Take 500 mg by mouth at bedtime.   Vitamin D, Ergocalciferol, (DRISDOL) 1.25 MG (50000 UNIT) CAPS capsule, Take 1 capsule (50,000 Units total) by mouth every 7 (seven) days.   Reviewed prior external information including notes and imaging from  primary care provider As well as notes that were available from care everywhere and other healthcare systems.  Past medical history, social, surgical and family history  all reviewed in electronic medical record.  No pertanent information unless stated regarding to the chief complaint.   Review of Systems:  No headache, visual changes, nausea, vomiting, diarrhea, constipation, dizziness, abdominal pain, skin rash, fevers, chills, night sweats, weight loss, swollen lymph nodes, body aches, joint swelling, chest pain, shortness of breath, mood changes. POSITIVE muscle aches  Objective  Blood pressure 110/72, pulse 78, height 5\' 5"  (1.651 m), weight 123 lb (55.8 kg), SpO2 98%, unknown if currently breastfeeding.   General: No apparent distress alert and oriented x3 mood and affect normal, dressed appropriately.  HEENT: Pupils equal, extraocular movements intact  Respiratory: Patient's speak in full sentences and does not appear short of breath  Cardiovascular: No lower extremity edema, non tender, no erythema  Low back does have some mild loss of lordosis.  Overall though seems to be better.  Patient unfortunately does have a positive grind test of the left hip noted.    Impression and Recommendations:    The above documentation has been reviewed and is accurate and complete Judi Saa, DO

## 2023-09-16 ENCOUNTER — Ambulatory Visit: Payer: BC Managed Care – PPO | Admitting: Family Medicine

## 2023-09-16 ENCOUNTER — Other Ambulatory Visit: Payer: Self-pay

## 2023-09-16 ENCOUNTER — Encounter: Payer: Self-pay | Admitting: Family Medicine

## 2023-09-16 VITALS — BP 110/72 | HR 78 | Ht 65.0 in | Wt 123.0 lb

## 2023-09-16 DIAGNOSIS — M25552 Pain in left hip: Secondary | ICD-10-CM | POA: Insufficient documentation

## 2023-09-16 DIAGNOSIS — G5701 Lesion of sciatic nerve, right lower limb: Secondary | ICD-10-CM | POA: Diagnosis not present

## 2023-09-16 DIAGNOSIS — M47816 Spondylosis without myelopathy or radiculopathy, lumbar region: Secondary | ICD-10-CM | POA: Diagnosis not present

## 2023-09-16 DIAGNOSIS — M25551 Pain in right hip: Secondary | ICD-10-CM | POA: Diagnosis not present

## 2023-09-16 DIAGNOSIS — M76891 Other specified enthesopathies of right lower limb, excluding foot: Secondary | ICD-10-CM | POA: Diagnosis not present

## 2023-09-16 NOTE — Assessment & Plan Note (Signed)
Facet arthropathy.  Discussed with patient about extension regimen and home exercises, has responded well to manipulation in the past but also has had some exacerbation.  Hopeful that this will make some improvement.  Discussed with patient about icing regimen and home exercises otherwise.  Increase activity slowly.  Follow-up with me again in 2 to 3 months otherwise.

## 2023-09-16 NOTE — Patient Instructions (Signed)
Watch L hip Squats only to bench See me in 2 months

## 2023-09-16 NOTE — Assessment & Plan Note (Signed)
Mild tightness noted still but overall seems to be doing better.  Concern for more of the left hip and the possibility of a labral pathology.  Discussed with patient just to limit the range of motion.  Increase activity.  Follow-up again in 2 to 3 months

## 2023-09-16 NOTE — Assessment & Plan Note (Signed)
Concern for labral tear. Discussed HEP  Discussed avoiding certain activities.  Such as deep squats.  Increase activity slowly otherwise.  Follow-up with me again in 6 to 8 weeks otherwise.

## 2023-09-16 NOTE — Assessment & Plan Note (Signed)
Continue to have tightness.  Discussed the possibility of injection.  We will hold at this time.  Patient did respond well to the piriformis injection.  Follow-up again in 6 to 8 weeks otherwise

## 2023-09-21 DIAGNOSIS — R634 Abnormal weight loss: Secondary | ICD-10-CM | POA: Diagnosis not present

## 2023-09-21 DIAGNOSIS — L299 Pruritus, unspecified: Secondary | ICD-10-CM | POA: Diagnosis not present

## 2023-09-21 DIAGNOSIS — R053 Chronic cough: Secondary | ICD-10-CM | POA: Diagnosis not present

## 2023-09-27 NOTE — Progress Notes (Signed)
Rubin Payor, PhD, LAT, ATC acting as a scribe for Clementeen Graham, MD.  Lynn [name] is a 42 y.o. female who presents to [facility] today for R knee pain. Pt was previously seen by Dr. Katrinka Blazing on 09/16/23 for R hip pain.   Today, pt c/o R knee pain ongoing since last weekend. He was doing a track workout w/ her son and tried to do a hurdle, landing awkwardly.  Pt locates pain to the anterior and posterior aspect of her R knee.  She has had a history of patellar tendinitis and a meniscus tear in the past.  She is a former Network engineer.  R Knee swelling: no Mechanical symptoms: yes Aggravates: squat, lunge Treatments tried: ice  Additionally she notes an exacerbation of chronic back pain.  She has had ongoing chronic back pain in the past.  She has had great benefit from facet injections at L4-5 facets.  She would like a repeat steroid injection now as the pain is returning.  She was in the process of getting geared up for medial branch block and ablation but thinks the facet steroid injection may be more helpful for her.  Pertinent review of systems: No fevers or chills  Relevant historical information: Chronic back pain.  Piriformis syndrome.   Exam:  BP 116/76   Pulse 77   Ht 5\' 5"  (1.651 m)   Wt 123 lb (55.8 kg)   SpO2 100%   BMI 20.47 kg/m  General: Well Developed, well nourished, and in no acute distress.   MSK: Right knee normal-appearing Normal motion with retropatellar crepitation. Tender palpation proximal patellar tendon. Stable ligamentous exam. Intact strength some pain resisted knee extension. Negative Murray's test.    Lab and Radiology Results  Diagnostic Limited MSK Ultrasound of: Right knee Quad tendon intact normal. Patellar tendon significant swelling at the proximal tendon at the middle third with increased tendon thickness measuring 1 cm.  Some hypoechoic change present at this area.  Small fleck of  hyperechoic change present as well indicating chronic calcific tendinitis.  No definitive tear is visible. Medial and lateral joint lines are normal. Posterior knee no Baker's cyst. Impression: Proximal patellar tendinitis     Assessment and Plan: 42 y.o. female with exacerbation of chronic right knee pain.  Pain due to to proximal patellar tendinitis.  Differential does include a small partial tear but that is less likely based on appearance.  Plan for home exercise program with ATC teaching in clinic today prior to discharge.  Also use nitroglycerin patch protocol.  Nitroglycerin patch as prescribed.  If needed will add physical therapy.  Patient will keep me updated.  Additionally she notes chronic low back pain due to facet arthritis.  She has had steroid injections in the past.  Plan for repeat bilateral facet injection L4-L5.  Proceed to medial branch block and ablation in the future if needed.   PDMP not reviewed this encounter. Orders Placed This Encounter  Procedures   [place] LIMITED JOINT SPACE STRUCTURES LOW RIGHT(NO LINKED CHARGES)    Reason for Exam (SYMPTOM  OR DIAGNOSIS REQUIRED):   right knee pain    Preferred imaging location?:   [facility] Knee AP/LAT W/[place] Right    Standing Status:   Future    Expiration Date:   10/29/2023    Reason for Exam (SYMPTOM  OR DIAGNOSIS REQUIRED):   right knee pain    Preferred imaging location?:  [facility]    Is patient pregnant?:   No   DG FACET JT INJ L /S SINGLE LEVEL LEFT W/FL/CT    Standing Status:   Future    Expiration Date:   09/27/2024    Reason for Exam (SYMPTOM  OR DIAGNOSIS REQUIRED):   BL L4-5    Is the patient pregnant?:   No    Preferred Imaging Location?:   [facility]    Radiology Contrast Protocol - do NOT remove file path:   [email or web address]   DG FACET JT INJ L /S SINGLE LEVEL RIGHT W/FL/CT    Standing Status:   Future     Expiration Date:   09/27/2024    Reason for Exam (SYMPTOM  OR DIAGNOSIS REQUIRED):   BL L4-5    Is the patient pregnant?:   No    Preferred Imaging Location?:   [facility]    Radiology Contrast Protocol - do NOT remove file path:   [email or web address]   Meds ordered this encounter  Medications   nitroGLYCERIN (NITRO-DUR) 0.2 mg/hr patch    Sig: Apply 1/4 of a patch to skin once daily.    Dispense:  30 patch    Refill:  0     Discussed warning signs or symptoms. Please see discharge instructions. Patient expresses understanding.   The above documentation has been reviewed and is accurate and complete Clementeen Graham, M.D.

## 2023-09-28 ENCOUNTER — Ambulatory Visit: Payer: BC Managed Care – PPO | Admitting: Family Medicine

## 2023-09-28 ENCOUNTER — Other Ambulatory Visit: Payer: Self-pay

## 2023-09-28 VITALS — BP 116/76 | HR 77 | Ht 65.0 in | Wt 123.0 lb

## 2023-09-28 DIAGNOSIS — G8929 Other chronic pain: Secondary | ICD-10-CM | POA: Diagnosis not present

## 2023-09-28 DIAGNOSIS — M5459 Other low back pain: Secondary | ICD-10-CM

## 2023-09-28 DIAGNOSIS — M545 Low back pain, unspecified: Secondary | ICD-10-CM

## 2023-09-28 DIAGNOSIS — M47816 Spondylosis without myelopathy or radiculopathy, lumbar region: Secondary | ICD-10-CM

## 2023-09-28 DIAGNOSIS — M25561 Pain in right knee: Secondary | ICD-10-CM

## 2023-09-28 DIAGNOSIS — M7651 Patellar tendinitis, right knee: Secondary | ICD-10-CM

## 2023-09-28 MED ORDER — NITROGLYCERIN 0.2 MG/HR TD PT24: MEDICATED_PATCH | TRANSDERMAL | 0 refills | Status: AC

## 2023-09-28 NOTE — Patient Instructions (Addendum)
Thank you for coming in today.   Please call [facility] ([facility]) at [phone] to schedule your spine injection.    Home exercises for patellar tendonitis.   Please work on the home exercises the athletic trainer went over with you:  View at [email or web address] using code: [email or web address]  Nitroglycerin Protocol  Apply 1/4 nitroglycerin patch to affected area daily. Change position of patch within the affected area every 24 hours. You may experience a headache during the first 1-2 weeks of using the patch, these should subside. If you experience headaches after beginning nitroglycerin patch treatment, you may take your preferred over the counter pain reliever. Another side effect of the nitroglycerin patch is skin irritation or rash related to patch adhesive. Please notify our office if you develop more severe headaches or rash, and stop the patch. Tendon healing with nitroglycerin patch may require 12 to 24 weeks depending on the extent of injury. Men should not use if taking Viagra, Cialis, or Levitra.  Do not use if you have migraines or rosacea.   Let me know if this is not working.

## 2023-10-14 NOTE — Discharge Instructions (Signed)

## 2023-10-15 ENCOUNTER — Inpatient Hospital Stay
Admission: RE | Admit: 2023-10-15 | Discharge: 2023-10-15 | Disposition: A | Discharge status: Home / Self Care | Payer: BC Managed Care – PPO | Source: Ambulatory Visit | Attending: Family Medicine | Admitting: Family Medicine

## 2023-10-15 ENCOUNTER — Inpatient Hospital Stay: Admission: RE | Admit: 2023-10-15 | Payer: BC Managed Care – PPO | Source: Ambulatory Visit

## 2023-10-15 ENCOUNTER — Encounter: Payer: Self-pay | Admitting: Family Medicine

## 2023-10-26 DIAGNOSIS — L728 Other follicular cysts of the skin and subcutaneous tissue: Secondary | ICD-10-CM | POA: Diagnosis not present

## 2023-11-03 NOTE — Progress Notes (Unsigned)
[name] [address] [place] [address] Phone: [phone] Subjective:   Lynn [name], am serving as a scribe for Dr. Antoine [name].  I'm seeing this patient by the request  of:  Lynn Hale, MD  CC: bilateral hip pain   [id number]  09/16/2023 Concern for labral tear. Discussed HEP  Discussed avoiding certain activities.  Such as deep squats.  Increase activity slowly otherwise.  Follow-up with me again in 6 to 8 weeks otherwise.     Mild tightness noted still but overall seems to be doing better.  Concern for more of the left hip and the possibility of a labral pathology.  Discussed with patient just to limit the range of motion.  Increase activity.  Follow-up again in 2 to 3 months      Update 11/04/2023 Lynn [name] is a 43 y.o. female coming in with complaint of B hip pain. Labral pathology on L side and piriformis syndrome R side. Patient states was suppose to get facet injections but was denied. Everything is about the same.       Past Medical History:  Diagnosis Date   COVID 08/12/2021   sore throat x 1 week all symptoms resolved   Herpes genitalia    History of palpitations 01/2021   with pregnancy resolved now per pt   Retained products of conception after miscarriage    Past Surgical History:  Procedure Laterality Date   DILATION AND EVACUATION N/A 08/27/2021   Procedure: DILATATION AND EVACUATION;  Surgeon: Lynn Mackie, MD;  Location: [facility];  Service: Gynecology;  Laterality: N/A;   LEEP  2016   OPERATIVE ULTRASOUND N/A 08/27/2021   Procedure: OPERATIVE ULTRASOUND;  Surgeon: Lynn Mackie, MD;  Location: [facility];  Service: Gynecology;  Laterality: N/A;   Social History   Socioeconomic History   Marital status: Married    Spouse name: Not on file   Number of children: 1   Years of education: Not on file   Highest education level: Not on file   Occupational History   Not on file  Tobacco Use   Smoking status: Former    Current packs/day: 0.00    Average packs/day: 0.5 packs/day for 10.0 years (5.0 ttl pk-yrs)    Types: Cigarettes    Start date: 2004    Quit date: 2014    Years since quitting: 11.0   Smokeless tobacco: Never  Vaping Use   Vaping status: Never Used  Substance and Sexual Activity   Alcohol use: Never   Drug use: Never   Sexual activity: Yes  Other Topics Concern   Not on file  Social History Narrative   Not on file   Social Drivers of Health   Financial Resource Strain: Not on file  Food Insecurity: Not on file  Transportation Needs: Not on file  Physical Activity: Not on file  Stress: Not on file  Social Connections: Not on file   No Known Allergies Family History  Problem Relation Age of Onset   Atrial fibrillation Mother      Current Outpatient Medications (Cardiovascular):    nitroGLYCERIN (NITRO-DUR) 0.2 mg/hr patch, Apply 1/4 of a patch to skin once daily.     Current Outpatient Medications (Other):    Vitamin D, Ergocalciferol, (DRISDOL) 1.25 MG (50000 UNIT) CAPS capsule, Take 1 capsule (50,000 Units total) by mouth every 7 (seven) days. (Patient not taking: Reported on 09/28/2023)   Reviewed prior external information  including notes and imaging from  primary care provider As well as notes that were available from care everywhere and other healthcare systems.  Past medical history, social, surgical and family history all reviewed in electronic medical record.  No pertanent information unless stated regarding to the chief complaint.   Review of Systems:  No headache, visual changes, nausea, vomiting, diarrhea, constipation, dizziness, abdominal pain, skin rash, fevers, chills, night sweats, weight loss, swollen lymph nodes, body aches, joint swelling, chest pain, shortness of breath, mood changes. POSITIVE muscle aches  Objective  Blood pressure 110/74, pulse 75, height 5\' 5"   (1.651 m), weight 126 lb (57.2 kg), SpO2 98%, unknown if currently breastfeeding.   General: No apparent distress alert and oriented x3 mood and affect normal, dressed appropriately.  HEENT: Pupils equal, extraocular movements intact  Respiratory: Patient's speak in full sentences and does not appear short of breath  Cardiovascular: No lower extremity edema, non tender, no erythema  Back exam does have some loss lordosis.  Tightness noted in the paraspinal musculature.  Worsening pain with extension noted. Right knee does have some tenderness to palpation over the patellar tendon but very minimal.  Patient does have great strength noted.  Limited muscular skeletal ultrasound was performed and interpreted by Lynn [name], M   Limited ultrasound shows the patient did have a partial tear noted.  Seems to be the deep fibers near the patella area. Impression: Healing partial tear of the patella tendon  Osteopathic findings C2 flexed rotated and side bent right C4 flexed rotated and side bent left C6 flexed rotated and side bent left T3 extended rotated and side bent right inhaled third rib T9 extended rotated and side bent left L2 flexed rotated and side bent right L3 flexed rotated and side bent left Sacrum right on right       Impression and Recommendations:    Facet arthropathy, lumbar Tightness still noted.  We discussed the facet arthropathy and the possible for the radiofrequency ablation.  This was approved last year but has not had it done yet.  Will start looking into this again.  Hopeful that this could make some improvement for her.  Discussed icing regimen and home exercises.  Attempted osteopathic manipulation again.  Follow-up again in 6 to 8 weeks     Decision today to treat with OMT was based on Physical Exam  After verbal consent patient was treated with HVLA, ME, FPR techniques in cervical, thoracic, rib, lumbar and sacral areas, all areas are chronic   Patient  tolerated the procedure well with improvement in symptoms  Patient given exercises, stretches and lifestyle modifications  See medications in patient instructions if given  Patient will follow up in 4-8 weeks  The above documentation has been reviewed and is accurate and complete Judi Saa, DO

## 2023-11-04 ENCOUNTER — Other Ambulatory Visit: Payer: Self-pay

## 2023-11-04 ENCOUNTER — Encounter: Payer: Self-pay | Admitting: Family Medicine

## 2023-11-04 ENCOUNTER — Ambulatory Visit: Payer: BC Managed Care – PPO | Admitting: Family Medicine

## 2023-11-04 VITALS — BP 110/74 | HR 75 | Ht 65.0 in | Wt 126.0 lb

## 2023-11-04 DIAGNOSIS — M47816 Spondylosis without myelopathy or radiculopathy, lumbar region: Secondary | ICD-10-CM

## 2023-11-04 DIAGNOSIS — M9903 Segmental and somatic dysfunction of lumbar region: Secondary | ICD-10-CM

## 2023-11-04 DIAGNOSIS — M25552 Pain in left hip: Secondary | ICD-10-CM | POA: Diagnosis not present

## 2023-11-04 DIAGNOSIS — M9904 Segmental and somatic dysfunction of sacral region: Secondary | ICD-10-CM

## 2023-11-04 DIAGNOSIS — M7651 Patellar tendinitis, right knee: Secondary | ICD-10-CM

## 2023-11-04 DIAGNOSIS — M9908 Segmental and somatic dysfunction of rib cage: Secondary | ICD-10-CM | POA: Diagnosis not present

## 2023-11-04 DIAGNOSIS — M9902 Segmental and somatic dysfunction of thoracic region: Secondary | ICD-10-CM

## 2023-11-04 DIAGNOSIS — M9901 Segmental and somatic dysfunction of cervical region: Secondary | ICD-10-CM | POA: Diagnosis not present

## 2023-11-04 NOTE — Patient Instructions (Signed)
Patella strap with activity Looks like its healing well Ablation was approved so can call about setting that up See you again in 2 months

## 2023-11-04 NOTE — Assessment & Plan Note (Signed)
Tightness still noted.  We discussed the facet arthropathy and the possible for the radiofrequency ablation.  This was approved last year but has not had it done yet.  Will start looking into this again.  Hopeful that this could make some improvement for her.  Discussed icing regimen and home exercises.  Attempted osteopathic manipulation again.  Follow-up again in 6 to 8 weeks

## 2023-11-04 NOTE — Assessment & Plan Note (Signed)
Overall doing well, continue nitroglycerin, we discussed Cho-Pat brace.

## 2023-11-05 DIAGNOSIS — Z0000 Encounter for general adult medical examination without abnormal findings: Secondary | ICD-10-CM | POA: Diagnosis not present

## 2023-11-05 DIAGNOSIS — R052 Subacute cough: Secondary | ICD-10-CM | POA: Diagnosis not present

## 2023-11-05 DIAGNOSIS — Z13220 Encounter for screening for lipoid disorders: Secondary | ICD-10-CM | POA: Diagnosis not present

## 2023-11-05 DIAGNOSIS — Z79899 Other long term (current) drug therapy: Secondary | ICD-10-CM | POA: Diagnosis not present

## 2023-11-16 ENCOUNTER — Encounter: Payer: Self-pay | Admitting: Family Medicine

## 2023-11-16 DIAGNOSIS — R052 Subacute cough: Secondary | ICD-10-CM | POA: Diagnosis not present

## 2023-11-17 ENCOUNTER — Encounter: Payer: Self-pay | Admitting: Family Medicine

## 2023-11-17 ENCOUNTER — Other Ambulatory Visit: Payer: Self-pay

## 2023-11-17 ENCOUNTER — Telehealth: Payer: Self-pay | Admitting: Family Medicine

## 2023-11-17 DIAGNOSIS — M47816 Spondylosis without myelopathy or radiculopathy, lumbar region: Secondary | ICD-10-CM

## 2023-11-17 NOTE — Telephone Encounter (Signed)
 I have faxed all info to fax number given below

## 2023-11-17 NOTE — Telephone Encounter (Signed)
 Whitley from [employer] called stating that the patient's insurance is needing more information for the Facet injection that was ordered. They are requesting clinicals, treatment plan, discharged need or plan of care.  This can be faxed to the [facility] Department at [phone].

## 2023-11-19 NOTE — Discharge Instructions (Signed)
Radio Frequency Ablation Post Procedure Discharge Instructions ? ?May resume a regular diet and any medications that you routinely take (including pain medications). ?No driving day of procedure. ?Upon discharge go home and rest for at least 4 hours.  May use an ice pack as needed to injection sites on back. ?Remove bandades later, today. ? ? ? ?Please contact our office at [phone] for the following symptoms: ? ?Fever greater than 100 degrees ?Increased swelling, pain, or redness at injection site. ? ? ?Thank you for visiting [facility].  ?

## 2023-11-22 ENCOUNTER — Other Ambulatory Visit: Payer: Self-pay | Admitting: Family Medicine

## 2023-11-22 ENCOUNTER — Ambulatory Visit
Admission: RE | Admit: 2023-11-22 | Discharge: 2023-11-22 | Disposition: A | Discharge status: Home / Self Care | Payer: BC Managed Care – PPO | Source: Ambulatory Visit | Attending: Family Medicine | Admitting: Family Medicine

## 2023-11-22 DIAGNOSIS — M47816 Spondylosis without myelopathy or radiculopathy, lumbar region: Secondary | ICD-10-CM

## 2023-11-22 MED ORDER — SODIUM CHLORIDE 0.9 % IV SOLN: INTRAVENOUS | Status: DC

## 2023-11-22 MED ORDER — KETOROLAC TROMETHAMINE 30 MG/ML IJ SOLN
30.0000 mg | Freq: Once | INTRAMUSCULAR | Status: AC
Start: 2023-11-22 — End: 2023-11-22
  Administered 2023-11-22: 30 mg via INTRAVENOUS

## 2023-11-22 MED ORDER — FENTANYL CITRATE PF 50 MCG/ML IJ SOSY
25.0000 ug | PREFILLED_SYRINGE | INTRAMUSCULAR | Status: DC | PRN
  Administered 2023-11-22 (×4): 25 ug via INTRAVENOUS

## 2023-11-22 MED ORDER — MIDAZOLAM HCL 2 MG/2ML IJ SOLN
1.0000 mg | INTRAMUSCULAR | Status: DC | PRN
  Administered 2023-11-22 (×4): 1 mg via INTRAVENOUS

## 2023-11-22 MED ORDER — METHYLPREDNISOLONE ACETATE 40 MG/ML INJ SUSP (RADIOLOG
80.0000 mg | Freq: Once | INTRAMUSCULAR | Status: AC
  Administered 2023-11-22: 80 mg via INTRALESIONAL

## 2023-11-22 NOTE — Progress Notes (Signed)
Pt back in nursing recovery area. Pt still drowsy from procedure but will wake up when spoken to. Pt follows commands, talks in complete sentences and has no complaints at this time. Pt will be monitored until discharged by Radiologist.   

## 2023-12-05 ENCOUNTER — Encounter: Payer: Self-pay | Admitting: Family Medicine

## 2023-12-16 NOTE — Progress Notes (Signed)
 [name] [address] [place] [address] Phone: [phone] Subjective:   Morene Antu am a scribe for Dr. Katrinka Blazing.   I'm seeing this patient by the request  of:  Shon Hale, MD  CC: back and neck pain follow up   [id number]  Lynn [name] is a 43 y.o. female coming in with complaint of back and neck pain. OMT on 11/04/2023. Patient states pretty bad. Had a RFA about 4 weeks ago. No improvement after procedure.   Medications patient has been prescribed: Vit D       Reviewed prior external information including notes and imaging from previsou exam, outside providers and external EMR if available.   As well as notes that were available from care everywhere and other healthcare systems.  Past medical history, social, surgical and family history all reviewed in electronic medical record.  No pertanent information unless stated regarding to the chief complaint.   Past Medical History:  Diagnosis Date   COVID 08/12/2021   sore throat x 1 week all symptoms resolved   Herpes genitalia    History of palpitations 01/2021   with pregnancy resolved now per pt   Retained products of conception after miscarriage     Allergies  Allergen Reactions   Amoxicillin-Pot Clavulanate Diarrhea    diarrhea, ?C. diff     Review of Systems:  No headache, visual changes, nausea, vomiting, diarrhea, constipation, dizziness, abdominal pain, skin rash, fevers, chills, night sweats, weight loss, swollen lymph nodes, body aches, joint swelling, chest pain, shortness of breath, mood changes. POSITIVE muscle aches  Objective  Blood pressure 110/60, pulse 66, height 5\' 5"  (1.651 m), weight 127 lb 12.8 oz (58 kg), last menstrual period 11/22/2023, SpO2 100%, unknown if currently breastfeeding.   General: No apparent distress alert and oriented x3 mood and affect normal, dressed appropriately.  HEENT: Pupils equal, extraocular movements intact   Respiratory: Patient's speak in full sentences and does not appear short of breath  Cardiovascular: No lower extremity edema, non tender, no erythema  Gait relatively normal MSK:  Back does have loss of lordosis noted.  Some tenderness to palpation in the paraspinal musculature.  Patient does have a severe tenderness to palpation over the sacroiliac joints bilaterally.  Worsening pain with extension especially over the right side.  Osteopathic findings  C2 flexed rotated and side bent right C6 flexed rotated and side bent left T3 extended rotated and side bent right inhaled rib T9 extended rotated and side bent left L1 flexed rotated and side bent right L3 flexed rotated and side bent left Sacrum right on right  After verbal consent patient was prepped with alcohol swab and with a 21-gauge 2 inch needle injected into the right sacroiliac joint with 0.5 cc of 0.5% Marcaine and 1 cc of Kenalog 40 mg/mL.  Minimal blood loss.  Band-Aid placed.  Postinjection instructions given.  After verbal consent patient was prepped with alcohol swab and with a 21-gauge 2 inch needle injected into the left sacroiliac joint with 2 cc of 0.5% Marcaine and 1 cc of Kenalog 40 mg/mL.  No blood loss.  Band-Aid placed.  Postinjection instructions given.     Assessment and Plan:  Chronic SI joint pain Patient given injections bilaterally.  Discussed icing regimen and home exercises, which activities to do and which ones to avoid.  Increase activity slowly.  Discussed icing regimen and home exercises, increase activity.  Follow-up again in 6 to 8 weeks otherwise.  Still differential is quite large with patient not making any significant improvement with certain things such as the RFA.  Has responded well to piriformis injection will monitor next time.  Follow-up with me again in 6 to 8 weeks.    Nonallopathic problems  Decision today to treat with OMT was based on Physical Exam  After verbal consent patient  was treated with HVLA, ME, FPR techniques in cervical, rib, thoracic, lumbar, and sacral  areas  Patient tolerated the procedure well with improvement in symptoms  Patient given exercises, stretches and lifestyle modifications  See medications in patient instructions if given  Patient will follow up in 4-8 weeks     The above documentation has been reviewed and is accurate and complete Judi Saa, DO         Note: This dictation was prepared with Dragon dictation along with smaller phrase technology. Any transcriptional errors that result from this process are unintentional.

## 2023-12-20 ENCOUNTER — Encounter: Payer: Self-pay | Admitting: Family Medicine

## 2023-12-20 ENCOUNTER — Ambulatory Visit: Payer: BC Managed Care – PPO | Admitting: Family Medicine

## 2023-12-20 VITALS — BP 110/60 | HR 66 | Ht 65.0 in | Wt 127.8 lb

## 2023-12-20 DIAGNOSIS — M9903 Segmental and somatic dysfunction of lumbar region: Secondary | ICD-10-CM | POA: Diagnosis not present

## 2023-12-20 DIAGNOSIS — M9908 Segmental and somatic dysfunction of rib cage: Secondary | ICD-10-CM

## 2023-12-20 DIAGNOSIS — M9902 Segmental and somatic dysfunction of thoracic region: Secondary | ICD-10-CM | POA: Diagnosis not present

## 2023-12-20 DIAGNOSIS — M9901 Segmental and somatic dysfunction of cervical region: Secondary | ICD-10-CM | POA: Diagnosis not present

## 2023-12-20 DIAGNOSIS — G8929 Other chronic pain: Secondary | ICD-10-CM

## 2023-12-20 DIAGNOSIS — M9904 Segmental and somatic dysfunction of sacral region: Secondary | ICD-10-CM

## 2023-12-20 DIAGNOSIS — M533 Sacrococcygeal disorders, not elsewhere classified: Secondary | ICD-10-CM

## 2023-12-20 NOTE — Patient Instructions (Addendum)
 Good to see you. Injections in SI joints today Return in 6-8 weeks

## 2023-12-20 NOTE — Assessment & Plan Note (Signed)
 Patient given injections bilaterally.  Discussed icing regimen and home exercises, which activities to do and which ones to avoid.  Increase activity slowly.  Discussed icing regimen and home exercises, increase activity.  Follow-up again in 6 to 8 weeks otherwise.  Still differential is quite large with patient not making any significant improvement with certain things such as the RFA.  Has responded well to piriformis injection will monitor next time.  Follow-up with me again in 6 to 8 weeks.

## 2023-12-27 ENCOUNTER — Encounter: Payer: Self-pay | Admitting: Family Medicine

## 2023-12-31 NOTE — Progress Notes (Signed)
 [name] [address] [place] [address] Phone: [phone] Subjective:    I'm seeing this patient by the request  of:  Shon Hale, MD  CC: Back and neck pain follow-up 3 months of rotator  [id number]  Lynn [name] is a 43 y.o. female coming in with complaint of back and neck pain. OMT 12/20/2023. Patient states that injections were not helpful. Still in same amount of pain. Able to workout but must modify her workouts.   Medications patient has been prescribed: None  Taking:         Reviewed prior external information including notes and imaging from previsou exam, outside providers and external EMR if available.   As well as notes that were available from care everywhere and other healthcare systems.  Past medical history, social, surgical and family history all reviewed in electronic medical record.  No pertanent information unless stated regarding to the chief complaint.   Past Medical History:  Diagnosis Date   COVID 08/12/2021   sore throat x 1 week all symptoms resolved   Herpes genitalia    History of palpitations 01/2021   with pregnancy resolved now per pt   Retained products of conception after miscarriage     Allergies  Allergen Reactions   Amoxicillin-Pot Clavulanate Diarrhea    diarrhea, ?C. diff     Review of Systems:  No headache, visual changes, nausea, vomiting, diarrhea, constipation, dizziness, abdominal pain, skin rash, fevers, chills, night sweats, weight loss, swollen lymph nodes, body aches, joint swelling, chest pain, shortness of breath, mood changes. POSITIVE muscle aches  Objective  Blood pressure 112/74, pulse 78, height 5\' 5"  (1.651 m), weight 125 lb (56.7 kg), SpO2 100%.   General: No apparent distress alert and oriented x3 mood and affect normal, dressed appropriately.  HEENT: Pupils equal, extraocular movements intact  Respiratory: Patient's speak in full sentences and does  not appear short of breath  Cardiovascular: No lower extremity edema, non tender, no erythema  Patient is moving significantly slowly.  Worsening pain with any 5 degrees of extension.  Patient actually has radicular symptoms with both extension and flexion greater than 10 degrees with radicular symptoms down the right leg.  This does seem to be worse.  Significant worsening pain to even palpation than previous exam over the right sacroiliac joint and the paraspinal musculature of the lumbar spine    Assessment and Plan:  Lumbar radiculopathy, right Significant worsening symptoms that is not responding to anything at this time.  Patient facet injections did not make a difference in the sacroiliac injections we tried last time did not make a significant difference whatsoever.  Patient has failed physical therapy, numerous different medications and once again the injections.  I feel with the radicular symptoms that advanced imaging is warranted at this time, will get a x-ray first to rule out any type of other bony abnormality but do feel the MRI is necessary to further evaluate for any other new nerve impingement.  Depending on the findings we will discuss further medical management or surgical intervention where appropriate.  Will also get a pelvic MRI secondary to the pain over the sacroiliac joint that is fairly significant as well.      The above documentation has been reviewed and is accurate and complete Judi Saa, DO          Note: This dictation was prepared with Dragon dictation along with smaller phrase technology. Any transcriptional errors that result  from this process are unintentional.

## 2024-01-04 ENCOUNTER — Ambulatory Visit (INDEPENDENT_AMBULATORY_CARE_PROVIDER_SITE_OTHER)

## 2024-01-04 ENCOUNTER — Ambulatory Visit: Admitting: Family Medicine

## 2024-01-04 VITALS — BP 112/74 | HR 78 | Ht 65.0 in | Wt 125.0 lb

## 2024-01-04 DIAGNOSIS — R102 Pelvic and perineal pain: Secondary | ICD-10-CM

## 2024-01-04 DIAGNOSIS — M545 Low back pain, unspecified: Secondary | ICD-10-CM | POA: Diagnosis not present

## 2024-01-04 DIAGNOSIS — M4186 Other forms of scoliosis, lumbar region: Secondary | ICD-10-CM | POA: Diagnosis not present

## 2024-01-04 DIAGNOSIS — G8929 Other chronic pain: Secondary | ICD-10-CM

## 2024-01-04 DIAGNOSIS — M5416 Radiculopathy, lumbar region: Secondary | ICD-10-CM | POA: Diagnosis not present

## 2024-01-04 DIAGNOSIS — M47816 Spondylosis without myelopathy or radiculopathy, lumbar region: Secondary | ICD-10-CM | POA: Diagnosis not present

## 2024-01-04 NOTE — Patient Instructions (Signed)
 MRI lumbar  MRI pelvis w contrast We will be in touch

## 2024-01-04 NOTE — Assessment & Plan Note (Signed)
 Significant worsening symptoms that is not responding to anything at this time.  Patient facet injections did not make a difference in the sacroiliac injections we tried last time did not make a significant difference whatsoever.  Patient has failed physical therapy, numerous different medications and once again the injections.  I feel with the radicular symptoms that advanced imaging is warranted at this time, will get a x-ray first to rule out any type of other bony abnormality but do feel the MRI is necessary to further evaluate for any other new nerve impingement.  Depending on the findings we will discuss further medical management or surgical intervention where appropriate.  Will also get a pelvic MRI secondary to the pain over the sacroiliac joint that is fairly significant as well.

## 2024-01-06 ENCOUNTER — Ambulatory Visit: Payer: BC Managed Care – PPO | Admitting: Family Medicine

## 2024-01-06 NOTE — Addendum Note (Signed)
 Addended by: Debbe Odea R on: 01/06/2024 10:06 AM   Modules accepted: Orders

## 2024-01-07 ENCOUNTER — Ambulatory Visit: Admitting: Sports Medicine

## 2024-01-13 ENCOUNTER — Encounter: Payer: Self-pay | Admitting: Family Medicine

## 2024-01-13 DIAGNOSIS — L820 Inflamed seborrheic keratosis: Secondary | ICD-10-CM | POA: Diagnosis not present

## 2024-01-13 DIAGNOSIS — L2989 Other pruritus: Secondary | ICD-10-CM | POA: Diagnosis not present

## 2024-01-13 DIAGNOSIS — L538 Other specified erythematous conditions: Secondary | ICD-10-CM | POA: Diagnosis not present

## 2024-01-13 DIAGNOSIS — Z789 Other specified health status: Secondary | ICD-10-CM | POA: Diagnosis not present

## 2024-01-16 ENCOUNTER — Other Ambulatory Visit

## 2024-01-18 ENCOUNTER — Ambulatory Visit (INDEPENDENT_AMBULATORY_CARE_PROVIDER_SITE_OTHER)

## 2024-01-18 ENCOUNTER — Encounter: Payer: Self-pay | Admitting: Family Medicine

## 2024-01-18 ENCOUNTER — Ambulatory Visit

## 2024-01-18 DIAGNOSIS — R102 Pelvic and perineal pain: Secondary | ICD-10-CM | POA: Diagnosis not present

## 2024-01-18 DIAGNOSIS — M51369 Other intervertebral disc degeneration, lumbar region without mention of lumbar back pain or lower extremity pain: Secondary | ICD-10-CM | POA: Diagnosis not present

## 2024-01-18 DIAGNOSIS — N888 Other specified noninflammatory disorders of cervix uteri: Secondary | ICD-10-CM | POA: Diagnosis not present

## 2024-01-18 DIAGNOSIS — M545 Low back pain, unspecified: Secondary | ICD-10-CM | POA: Diagnosis not present

## 2024-01-18 DIAGNOSIS — G8929 Other chronic pain: Secondary | ICD-10-CM

## 2024-01-18 MED ORDER — GADOBUTROL 1 MMOL/ML IV SOLN
6.0000 mL | Freq: Once | INTRAVENOUS | Status: AC | PRN
  Administered 2024-01-18: 6 mL via INTRAVENOUS

## 2024-02-03 ENCOUNTER — Encounter: Payer: Self-pay | Admitting: Family Medicine

## 2024-02-03 NOTE — Progress Notes (Signed)
 [facility] [address] [place] [address] Phone: [phone] Subjective:   Lynn [name], am serving as a scribe for Dr. Ronnell Coins.  I'm seeing this patient by the request  of:  Ransom Byers, MD  CC: Low back pain and pelvis pain  [id number]  01/04/2024 Significant worsening symptoms that is not responding to anything at this time.  Patient facet injections did not make a difference in the sacroiliac injections we tried last time did not make a significant difference whatsoever.  Patient has failed physical therapy, numerous different medications and once again the injections.  I feel with the radicular symptoms that advanced imaging is warranted at this time, will get a x-ray first to rule out any type of other bony abnormality but do feel the MRI is necessary to further evaluate for any other new nerve impingement.  Depending on the findings we will discuss further medical management or surgical intervention where appropriate.  Will also get a pelvic MRI secondary to the pain over the sacroiliac joint that is fairly significant as well.      Update 02/08/2024 Lynn [name] is a 43 y.o. female coming in with complaint of R knee issues an talk about recent MRI results.    Patient's MRI of the pelvis with and without contrast did show the bilateral ovarian and signs consistent with more of a pelvic congestion syndrome.  Also has uterine adenomyosis.  Patient also had nonspecific bilateral sacral level erector spinae muscle edema and myositis worse on the right.  Degenerative disc disease noted at L4-L5 and L5-S1.  Past Medical History:  Diagnosis Date   COVID 08/12/2021   sore throat x 1 week all symptoms resolved   Herpes genitalia    History of palpitations 01/2021   with pregnancy resolved now per pt   Retained products of conception after miscarriage    Past Surgical History:  Procedure Laterality Date   DILATION AND  EVACUATION N/A 08/27/2021   Procedure: DILATATION AND EVACUATION;  Surgeon: Meriam Stamp, MD;  Location: [facility];  Service: Gynecology;  Laterality: N/A;   LEEP  2016   OPERATIVE ULTRASOUND N/A 08/27/2021   Procedure: OPERATIVE ULTRASOUND;  Surgeon: Meriam Stamp, MD;  Location: [facility];  Service: Gynecology;  Laterality: N/A;   Social History   Socioeconomic History   Marital status: Married    Spouse name: Not on file   Number of children: 1   Years of education: Not on file   Highest education level: Not on file  Occupational History   Not on file  Tobacco Use   Smoking status: Former    Current packs/day: 0.00    Average packs/day: 0.5 packs/day for 10.0 years (5.0 ttl pk-yrs)    Types: Cigarettes    Start date: 2004    Quit date: 2014    Years since quitting: 11.3   Smokeless tobacco: Never  Vaping Use   Vaping status: Never Used  Substance and Sexual Activity   Alcohol use: Never   Drug use: Never   Sexual activity: Yes  Other Topics Concern   Not on file  Social History Narrative   Not on file   Social Drivers of Health   Financial Resource Strain: Not on file  Food Insecurity: Not on file  Transportation Needs: Not on file  Physical Activity: Not on file  Stress: Not on file  Social Connections: Not on file   Allergies  Allergen  Reactions   Amoxicillin-Pot Clavulanate Diarrhea    diarrhea, ?C. diff   Family History  Problem Relation Age of Onset   Atrial fibrillation Mother      Current Outpatient Medications (Cardiovascular):    nitroGLYCERIN  (NITRO-DUR ) 0.2 mg/hr patch, Apply 1/4 of a patch to skin once daily.     Current Outpatient Medications (Other):    doxycycline  (VIBRA -TABS) 100 MG tablet, Take 1 tablet (100 mg total) by mouth 2 (two) times daily.   Vitamin D , Ergocalciferol , (DRISDOL ) 1.25 MG (50000 UNIT) CAPS capsule, Take 1 capsule (50,000 Units total) by mouth every 7 (seven)  days.   Reviewed prior external information including notes and imaging from  primary care provider As well as notes that were available from care everywhere and other healthcare systems.  Past medical history, social, surgical and family history all reviewed in electronic medical record.  No pertanent information unless stated regarding to the chief complaint.   Review of Systems:  No headache, visual changes, nausea, vomiting, diarrhea, constipation, dizziness, abdominal pain, skin rash, fevers, chills, night sweats, weight loss, swollen lymph nodes, body aches, joint swelling, chest pain, shortness of breath, mood changes. POSITIVE muscle aches  Objective  Blood pressure 104/70, pulse 78, height 5\' 5"  (1.651 m), weight 126 lb (57.2 kg), SpO2 98%.   General: No apparent distress alert and oriented x3 mood and affect normal, dressed appropriately.  HEENT: Pupils equal, extraocular movements intact  Respiratory: Patient's speak in full sentences and does not appear short of breath  Cardiovascular: No lower extremity edema, non tender, no erythema  Low back exam shows patient does have some tenderness to palpation noted.  Some tightness with Veldon German right greater than left. Tightness over the sacroiliac joints bilaterally.  Osteopathic findings C2 flexed rotated and side bent right C4 flexed rotated and side bent left C6 flexed rotated and side bent left T3 extended rotated and side bent right inhaled third rib T9 extended rotated and side bent left L2 flexed rotated and side bent right L5 flexed rotated and side bent left Sacrum right on right    Impression and Recommendations:     The above documentation has been reviewed and is accurate and complete Sou Nohr M Elmor Kost, DO

## 2024-02-08 ENCOUNTER — Ambulatory Visit: Admitting: Family Medicine

## 2024-02-08 ENCOUNTER — Encounter: Payer: Self-pay | Admitting: Family Medicine

## 2024-02-08 VITALS — BP 104/70 | HR 78 | Ht 65.0 in | Wt 126.0 lb

## 2024-02-08 DIAGNOSIS — M9901 Segmental and somatic dysfunction of cervical region: Secondary | ICD-10-CM | POA: Diagnosis not present

## 2024-02-08 DIAGNOSIS — M25561 Pain in right knee: Secondary | ICD-10-CM | POA: Diagnosis not present

## 2024-02-08 DIAGNOSIS — M9902 Segmental and somatic dysfunction of thoracic region: Secondary | ICD-10-CM

## 2024-02-08 DIAGNOSIS — M533 Sacrococcygeal disorders, not elsewhere classified: Secondary | ICD-10-CM | POA: Diagnosis not present

## 2024-02-08 DIAGNOSIS — M9908 Segmental and somatic dysfunction of rib cage: Secondary | ICD-10-CM | POA: Diagnosis not present

## 2024-02-08 DIAGNOSIS — M9904 Segmental and somatic dysfunction of sacral region: Secondary | ICD-10-CM | POA: Diagnosis not present

## 2024-02-08 DIAGNOSIS — M9903 Segmental and somatic dysfunction of lumbar region: Secondary | ICD-10-CM

## 2024-02-08 DIAGNOSIS — G8929 Other chronic pain: Secondary | ICD-10-CM

## 2024-02-08 MED ORDER — DOXYCYCLINE HYCLATE 100 MG PO TABS: 100.0000 mg | ORAL_TABLET | Freq: Two times a day (BID) | ORAL | 0 refills | Status: AC

## 2024-02-08 NOTE — Assessment & Plan Note (Addendum)
 Continues to have pain that is out of proportion.  Injections in the SI joint did not make any significant improvement.  Attempting some injections in the muscle and in the sacroiliac joint previously.  Mild myositis noted on MRI but otherwise fairly unremarkable.  Discussed icing regimen and home exercises.  Increase activity slowly.  Follow-up again in 6 to 8 weeks.

## 2024-02-08 NOTE — Patient Instructions (Signed)
 Brace today Medication called in See me in 2 months

## 2024-02-17 ENCOUNTER — Other Ambulatory Visit: Payer: Self-pay

## 2024-02-17 ENCOUNTER — Ambulatory Visit: Admitting: Family Medicine

## 2024-02-17 ENCOUNTER — Encounter: Payer: Self-pay | Admitting: Family Medicine

## 2024-02-17 VITALS — BP 110/80 | HR 69 | Ht 65.0 in | Wt 128.0 lb

## 2024-02-17 DIAGNOSIS — M25551 Pain in right hip: Secondary | ICD-10-CM | POA: Diagnosis not present

## 2024-02-17 DIAGNOSIS — G8929 Other chronic pain: Secondary | ICD-10-CM

## 2024-02-17 DIAGNOSIS — M545 Low back pain, unspecified: Secondary | ICD-10-CM

## 2024-02-17 DIAGNOSIS — M791 Myalgia, unspecified site: Secondary | ICD-10-CM

## 2024-02-17 MED ORDER — TIZANIDINE HCL 2 MG PO TABS: 2.0000 mg | ORAL_TABLET | Freq: Three times a day (TID) | ORAL | 1 refills | Status: AC | PRN

## 2024-02-17 MED ORDER — METHYLPREDNISOLONE ACETATE 40 MG/ML IJ SUSP
40.0000 mg | Freq: Once | INTRAMUSCULAR | Status: AC
  Administered 2024-02-17: 40 mg via INTRA_ARTICULAR

## 2024-02-17 NOTE — Progress Notes (Signed)
 I, Miquel Amen, CMA acting as a scribe for Garlan Juniper, MD.  Lynn [name] is a 43 y.o. female who presents to [facility] today for buttock pain. Pt was previously seen by Dr. Beather Bouchard 02/08/24 for OMT.  Today, pt c/o R buttock pain x 1 week. Sx off and on, hx of torn hamstring 1 year ago. Locates pain to laeral aspect of the hip radiating into the gluteal region. Having tightness in the lower back. Currently in PT, possible strain to the lower back. Minimal sharp shooting pain. Denies groin pain, n/t/w. Has tried ice and Prednisone , no relief while on Prednisone . Has also tried IBU and Tylenol . Compliant with HEP but not during flareup.   Dx imaging: 01/18/24 L-spine & pelvis MRI  01/04/24 L-spine & pelvis XR  Pertinent review of systems: No fevers or chills  Relevant historical information: History of piriformis syndrome and stress fractures.  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   Exam:  BP 110/80   Pulse 69   Ht 5\' 5"  (1.651 m)   Wt 128 lb (58.1 kg)   SpO2 99%   BMI 21.30 kg/m  General: Well Developed, well nourished, and in no acute distress.   MSK: Right hip normal-appearing Tender palpation lateral hip at iliac crest. Additionally tender to palpation at the ischial tuberosity medially. Some pain is reproduced with resisted hip abduction and resisted knee flexion.    Lab and Radiology Results  Procedure: Real-time Ultrasound Guided Injection of right lateral hip iliac crest area of tenderness Device: Philips Affiniti 50G/GE Logiq Images permanently stored and available for review in PACS Verbal informed consent obtained.  Discussed risks and benefits of procedure. Warned about infection, bleeding, hyperglycemia damage to structures among others. Patient expresses understanding and agreement Time-out conducted.   Noted no overlying erythema, induration, or other signs of local infection.   Skin  prepped in a sterile fashion.   Local anesthesia: Topical Ethyl chloride.   With sterile technique and under real time ultrasound guidance: 40 mg of Depo-Medrol  and 2 mL of Marcaine  injected into lateral hip. Fluid seen entering the lateral hip.   Completed without difficulty   Pain immediately resolved suggesting accurate placement of the medication.   Advised to call if fevers/chills, erythema, induration, drainage, or persistent bleeding.   Images permanently stored and available for review in the ultrasound unit.  Impression: Technically successful ultrasound guided injection.   EXAM: MRI LUMBAR SPINE WITHOUT CONTRAST   TECHNIQUE: Multiplanar, multisequence MR imaging of the lumbar spine was performed. No intravenous contrast was administered.   COMPARISON:  Lumbar MRI 03/21/2023.  Lumbar radiographs 01/04/2024.   FINDINGS: Segmentation: Normal on the comparison radiographs which is the same numbering system used on the MRI last year.   Alignment: Mild underlying dextroconvex upper and levoconvex lower lumbar scoliosis as seen on radiographs. Stable mild straightening of lumbar lordosis since last year.   Vertebrae: Maintained vertebral height and normal background bone marrow signal. Small benign L1 vertebral body hemangioma (normal variant). Intact visible sacrum. No marrow edema or evidence of acute osseous abnormality.   Conus medullaris and cauda equina: Conus extends to the T12-L1 level. No lower spinal cord or conus signal abnormality. Capacious spinal canal and normal cauda equina nerve roots.   Paraspinal and other soft tissues:   Positive for bilateral sacral level erector spinae muscle T2 and STIR hyperintensity which is worse on the right side (series 6, image 1 and series  8, image 37), and new from last year. No paraspinal muscle fluid collection.   But the the lumbar paraspinal muscles seem to remain normal (except for mild L5 level involvement on the  right side). No presacral fluid or edema identified. Grossly normal visible SI joints.   Negative visible abdominal viscera.   Disc levels:   T11-T12 through L2-L3: Negative.   L3-L4: Negative disc. Mild facet and ligament flavum hypertrophy. No stenosis.   L4-L5: Disc desiccation. Mild circumferential disc bulging. Subtle posterior annular fissure suspected on series 8, image 29. Mild facet and moderate ligament flavum hypertrophy. No stenosis.   L5-S1: Disc desiccation. Mild circumferential disc bulge. Mild facet hypertrophy. No stenosis.   IMPRESSION: 1. Positive for non-specific bilateral sacral level erector spinae muscle edema / myositis worse on the right side. No associated paraspinal fluid collection, and the lumbar paraspinal muscles essentially remain normal. Has there been recent trauma or sacral procedure to explain this appearance?   2. Lumbar spine disc degeneration at L4-L5 and L5-S1, and degenerative facet hypertrophy L3-L4 through L5-S1. But capacious spinal canal with no spinal stenosis or neural impingement.     Electronically Signed   By: Marlise Simpers M.D.   On: 02/03/2024 10:35   EXAM: MRI PELVIS WITHOUT AND WITH CONTRAST   TECHNIQUE: Multiplanar multisequence MR imaging of the pelvis was performed both before and after administration of intravenous contrast.   CONTRAST:  6mL GADAVIST  GADOBUTROL  1 MMOL/ML IV SOLN   COMPARISON:  None Available.   FINDINGS: Urinary Tract:  No abnormality visualized.   Bowel:  Unremarkable visualized pelvic bowel loops.   Vascular/Lymphatic: No pathologically enlarged lymph nodes. Numerous prominent bilateral ovarian and adnexal varices.   Reproductive: No mass. IUD present in the fundal endometrial cavity. Probable uterine adenomyosis (series 6, image 18). Benign nabothian cysts of the cervix, requiring no specific further follow-up or characterization. Small benign bilateral functional ovarian follicles,  requiring no further follow-up or characterization.   Other:  None.   Musculoskeletal: No suspicious bone lesions identified. Minimal osteophytosis of the bilateral sacroiliac joints without associated marrow edema.   IMPRESSION: 1. Numerous prominent bilateral ovarian and adnexal varices, which can be seen in the setting of pelvic congestion if clinically referable signs and symptoms are present. 2. Probable uterine adenomyosis. 3. IUD present in the fundal endometrial cavity. 4. Minimal osteophytosis of the bilateral sacroiliac joints without associated marrow edema or other inflammatory findings.     Electronically Signed   By: Fredricka Jenny M.D.   On: 01/18/2024 16:40 I, Garlan Juniper, personally (independently) visualized and performed the interpretation of the images attached in this note.    Assessment and Plan: 43 y.o. female with chronic right hip pain.  Pain is multifactorial.  She does have lumbar spine related pain and pain from hamstring strain and lateral hip etiology.  She is scheduled to see Dr. Ibazebo at the end of the month to consider other options for pain.  For now we will go ahead and try a targeted injection at the iliac crest.  If needed we can also try targeted injection at the hamstring origin at ischial tuberosity.  Tizanidine  refilled.  Additionally we will finish rheumatologic workup with HLA-B27.  Other rheumatology labs have been normal.   PDMP not reviewed this encounter. Orders Placed This Encounter  Procedures   US  LIMITED JOINT SPACE STRUCTURES LOW RIGHT(NO LINKED CHARGES)    Reason for Exam (SYMPTOM  OR DIAGNOSIS REQUIRED):   right hip/lowback pain  Preferred imaging location?:   [facility]   HLA-B27 Antigen    Standing Status:   Future    Number of Occurrences:   1    Expiration Date:   02/16/2025   Meds ordered this encounter  Medications   tiZANidine  (ZANAFLEX ) 2 MG tablet    Sig: Take 1-2 tablets (2-4 mg total)  by mouth every 8 (eight) hours as needed.    Dispense:  60 tablet    Refill:  1   methylPREDNISolone  acetate (DEPO-MEDROL ) injection 40 mg     Discussed warning signs or symptoms. Please see discharge instructions. Patient expresses understanding.   The above documentation has been reviewed and is accurate and complete Garlan Juniper, M.D.

## 2024-02-17 NOTE — Patient Instructions (Addendum)
 Thank you for coming in today.   You received an injection today. Seek immediate medical attention if the joint becomes red, extremely painful, or is oozing fluid.   I've sent a prescription for tizanidine to your pharmacy.   Please get labs today before you leave

## 2024-02-18 ENCOUNTER — Encounter: Payer: Self-pay | Admitting: Family Medicine

## 2024-02-18 LAB — HLA-B27 ANTIGEN: HLA-B27 Antigen: NEGATIVE

## 2024-02-18 NOTE — Addendum Note (Signed)
 Addended by: Syliva Even on: 02/18/2024 06:47 AM   Modules accepted: Level of Service

## 2024-02-21 NOTE — Progress Notes (Signed)
 HLA-B27 is negative.  This is typically seen with psoriatic arthritis and ankylosing spondylitis.

## 2024-02-25 DIAGNOSIS — R399 Unspecified symptoms and signs involving the genitourinary system: Secondary | ICD-10-CM | POA: Diagnosis not present

## 2024-02-29 DIAGNOSIS — R300 Dysuria: Secondary | ICD-10-CM | POA: Diagnosis not present

## 2024-03-06 ENCOUNTER — Other Ambulatory Visit: Payer: Self-pay | Admitting: Family Medicine

## 2024-03-16 DIAGNOSIS — M5416 Radiculopathy, lumbar region: Secondary | ICD-10-CM | POA: Diagnosis not present

## 2024-03-16 DIAGNOSIS — M5126 Other intervertebral disc displacement, lumbar region: Secondary | ICD-10-CM | POA: Diagnosis not present

## 2024-03-24 DIAGNOSIS — Z1231 Encounter for screening mammogram for malignant neoplasm of breast: Secondary | ICD-10-CM | POA: Diagnosis not present

## 2024-03-24 DIAGNOSIS — Z124 Encounter for screening for malignant neoplasm of cervix: Secondary | ICD-10-CM | POA: Diagnosis not present

## 2024-03-24 DIAGNOSIS — Z01419 Encounter for gynecological examination (general) (routine) without abnormal findings: Secondary | ICD-10-CM | POA: Diagnosis not present

## 2024-03-24 DIAGNOSIS — Z1331 Encounter for screening for depression: Secondary | ICD-10-CM | POA: Diagnosis not present

## 2024-03-27 DIAGNOSIS — M5416 Radiculopathy, lumbar region: Secondary | ICD-10-CM | POA: Diagnosis not present

## 2024-04-06 ENCOUNTER — Ambulatory Visit: Admitting: Family Medicine

## 2024-04-18 DIAGNOSIS — M47816 Spondylosis without myelopathy or radiculopathy, lumbar region: Secondary | ICD-10-CM | POA: Diagnosis not present

## 2024-05-10 DIAGNOSIS — N8003 Adenomyosis of the uterus: Secondary | ICD-10-CM | POA: Diagnosis not present

## 2024-05-12 DIAGNOSIS — M47816 Spondylosis without myelopathy or radiculopathy, lumbar region: Secondary | ICD-10-CM | POA: Diagnosis not present

## 2024-05-18 NOTE — Progress Notes (Unsigned)
 [facility] [address] [place] [address] Phone: [phone] Subjective:   ISusannah Gully, am serving as a scribe for Dr. Arthea Claudene.  I'm seeing this patient by the request  of:  Chrystal Lamarr RAMAN, MD  CC: Low back pain  YEP:Dlagzrupcz  Kendle Turbin is a 43 y.o. female coming in with complaint of back and neck pain. OMT 02/08/2024. Iliac crest injection by Dr. Joane in May 2025. Patient states seeing Dr. Joane did help. Chronic low back pain. Wanted to talk about the facet injections last week at [facility].  Medications patient has been prescribed:   Taking:    Reviewing patient's chart has been seen multiple times for her uterine adenomyosis.     Reviewed prior external information including notes and imaging from previsou exam, outside providers and external EMR if available.   As well as notes that were available from care everywhere and other healthcare systems.  Past medical history, social, surgical and family history all reviewed in electronic medical record.  No pertanent information unless stated regarding to the chief complaint.   Past Medical History:  Diagnosis Date   COVID 08/12/2021   sore throat x 1 week all symptoms resolved   Herpes genitalia    History of palpitations 01/2021   with pregnancy resolved now per pt   Retained products of conception after miscarriage     Allergies  Allergen Reactions   Amoxicillin-Pot Clavulanate Diarrhea    diarrhea, ?C. diff     Review of Systems:  No headache, visual changes, nausea, vomiting, diarrhea, constipation, dizziness, abdominal pain, skin rash, fevers, chills, night sweats, weight loss, swollen lymph nodes, body aches, joint swelling, chest pain, shortness of breath, mood changes. POSITIVE muscle aches  Objective  Blood pressure 122/70, pulse 70, height 5' 5 (1.651 m), weight 127 lb (57.6 kg), SpO2 97%.   General: No apparent distress alert and  oriented x3 mood and affect normal, dressed appropriately.  HEENT: Pupils equal, extraocular movements intact  Respiratory: Patient's speak in full sentences and does not appear short of breath  Cardiovascular: No lower extremity edema, non tender, no erythema  Gait MSK:  Back does have some loss lordosis noted, some tenderness to palpation in the paraspinal musculature.  Seems to be more right greater than left.  Tightness noted in multiple different areas. Patient would not even attempt extension because then would have significant pain from days usually.  Can flex but does have more discomfort.  Able to get up from a seated position when trying to keep up with her very wonderful but active daughter.       Assessment and Plan:  Lumbar radiculopathy, right Continues to have 90% of the pain on the right side but can still have the left side.  Previous imaging did not show any specific signs that would be showing why patient continues to have what seems to be some instability noted of the lower back.  Discussed with patient about new medication, Genapax and see if this will be beneficial.  Discussed icing regimen and home exercises, discussed which activities to do in which ones to avoid.  Increase activity slowly.  Discussed icing regimen.  Otherwise would need to consider some possible injections.  Nerve conduction study has been normal.  Follow-up again in 2 months       The above documentation has been reviewed and is accurate and complete Aslin Farinas M Lilyrose Tanney, DO  Note: This dictation was prepared with Dragon dictation along with smaller phrase technology. Any transcriptional errors that result from this process are unintentional.

## 2024-05-19 ENCOUNTER — Encounter: Payer: Self-pay | Admitting: Family Medicine

## 2024-05-19 ENCOUNTER — Ambulatory Visit: Admitting: Family Medicine

## 2024-05-19 VITALS — BP 122/70 | HR 70 | Ht 65.0 in | Wt 127.0 lb

## 2024-05-19 DIAGNOSIS — M5416 Radiculopathy, lumbar region: Secondary | ICD-10-CM

## 2024-05-19 MED ORDER — JOURNAVX 50 MG PO TABS: 50.0000 mg | ORAL_TABLET | Freq: Two times a day (BID) | ORAL | 2 refills | Status: AC

## 2024-05-19 NOTE — Assessment & Plan Note (Signed)
 Continues to have 90% of the pain on the right side but can still have the left side.  Previous imaging did not show any specific signs that would be showing why patient continues to have what seems to be some instability noted of the lower back.  Discussed with patient about new medication, Genapax and see if this will be beneficial.  Discussed icing regimen and home exercises, discussed which activities to do in which ones to avoid.  Increase activity slowly.  Discussed icing regimen.  Otherwise would need to consider some possible injections.  Nerve conduction study has been normal.  Follow-up again in 2 months

## 2024-05-19 NOTE — Patient Instructions (Addendum)
 Prescription sent See you again in 2 months Update din 2 weeks

## 2024-05-29 DIAGNOSIS — M5416 Radiculopathy, lumbar region: Secondary | ICD-10-CM | POA: Diagnosis not present

## 2024-05-29 DIAGNOSIS — Z5181 Encounter for therapeutic drug level monitoring: Secondary | ICD-10-CM | POA: Diagnosis not present

## 2024-05-29 DIAGNOSIS — G894 Chronic pain syndrome: Secondary | ICD-10-CM | POA: Diagnosis not present

## 2024-05-29 DIAGNOSIS — Z79899 Other long term (current) drug therapy: Secondary | ICD-10-CM | POA: Diagnosis not present

## 2024-05-29 DIAGNOSIS — M5441 Lumbago with sciatica, right side: Secondary | ICD-10-CM | POA: Diagnosis not present

## 2024-06-01 DIAGNOSIS — S76311A Strain of muscle, fascia and tendon of the posterior muscle group at thigh level, right thigh, initial encounter: Secondary | ICD-10-CM | POA: Diagnosis not present

## 2024-06-05 DIAGNOSIS — S73191A Other sprain of right hip, initial encounter: Secondary | ICD-10-CM | POA: Diagnosis not present

## 2024-06-06 DIAGNOSIS — L538 Other specified erythematous conditions: Secondary | ICD-10-CM | POA: Diagnosis not present

## 2024-06-06 DIAGNOSIS — L2989 Other pruritus: Secondary | ICD-10-CM | POA: Diagnosis not present

## 2024-06-06 DIAGNOSIS — R208 Other disturbances of skin sensation: Secondary | ICD-10-CM | POA: Diagnosis not present

## 2024-06-06 DIAGNOSIS — D225 Melanocytic nevi of trunk: Secondary | ICD-10-CM | POA: Diagnosis not present

## 2024-06-06 DIAGNOSIS — L820 Inflamed seborrheic keratosis: Secondary | ICD-10-CM | POA: Diagnosis not present

## 2024-06-19 DIAGNOSIS — S73191D Other sprain of right hip, subsequent encounter: Secondary | ICD-10-CM | POA: Diagnosis not present

## 2024-07-06 DIAGNOSIS — S73191D Other sprain of right hip, subsequent encounter: Secondary | ICD-10-CM | POA: Diagnosis not present

## 2024-07-19 ENCOUNTER — Ambulatory Visit: Admitting: Family Medicine

## 2024-07-24 DIAGNOSIS — M533 Sacrococcygeal disorders, not elsewhere classified: Secondary | ICD-10-CM | POA: Diagnosis not present

## 2024-08-14 ENCOUNTER — Encounter: Payer: Self-pay | Admitting: Radiology

## 2024-08-15 DIAGNOSIS — M545 Low back pain, unspecified: Secondary | ICD-10-CM | POA: Diagnosis not present

## 2024-08-15 DIAGNOSIS — G8929 Other chronic pain: Secondary | ICD-10-CM | POA: Diagnosis not present

## 2024-08-24 DIAGNOSIS — G894 Chronic pain syndrome: Secondary | ICD-10-CM | POA: Diagnosis not present

## 2024-08-24 DIAGNOSIS — M47816 Spondylosis without myelopathy or radiculopathy, lumbar region: Secondary | ICD-10-CM | POA: Diagnosis not present

## 2024-08-24 DIAGNOSIS — G588 Other specified mononeuropathies: Secondary | ICD-10-CM | POA: Diagnosis not present

## 2024-09-01 DIAGNOSIS — M25551 Pain in right hip: Secondary | ICD-10-CM | POA: Diagnosis not present

## 2024-09-05 DIAGNOSIS — M25551 Pain in right hip: Secondary | ICD-10-CM | POA: Diagnosis not present
